# Patient Record
Sex: Female | Born: 1965
Health system: Southern US, Community
[De-identification: ages and names within clinical notes are randomized; demographics above are authoritative.]

## PROBLEM LIST (undated history)

## (undated) DIAGNOSIS — I10 Essential (primary) hypertension: Secondary | ICD-10-CM

## (undated) DIAGNOSIS — N200 Calculus of kidney: Secondary | ICD-10-CM

## (undated) DIAGNOSIS — G4730 Sleep apnea, unspecified: Secondary | ICD-10-CM

## (undated) DIAGNOSIS — H269 Unspecified cataract: Secondary | ICD-10-CM

## (undated) DIAGNOSIS — M5030 Other cervical disc degeneration, unspecified cervical region: Secondary | ICD-10-CM

## (undated) DIAGNOSIS — R011 Cardiac murmur, unspecified: Secondary | ICD-10-CM

## (undated) DIAGNOSIS — F32A Depression, unspecified: Secondary | ICD-10-CM

## (undated) DIAGNOSIS — M1990 Unspecified osteoarthritis, unspecified site: Secondary | ICD-10-CM

## (undated) DIAGNOSIS — F419 Anxiety disorder, unspecified: Secondary | ICD-10-CM

## (undated) DIAGNOSIS — M797 Fibromyalgia: Secondary | ICD-10-CM

## (undated) DIAGNOSIS — I639 Cerebral infarction, unspecified: Secondary | ICD-10-CM

## (undated) DIAGNOSIS — K219 Gastro-esophageal reflux disease without esophagitis: Secondary | ICD-10-CM

## (undated) DIAGNOSIS — J45909 Unspecified asthma, uncomplicated: Secondary | ICD-10-CM

## (undated) DIAGNOSIS — G43909 Migraine, unspecified, not intractable, without status migrainosus: Secondary | ICD-10-CM

## (undated) DIAGNOSIS — T7840XA Allergy, unspecified, initial encounter: Secondary | ICD-10-CM

## (undated) DIAGNOSIS — M5020 Other cervical disc displacement, unspecified cervical region: Secondary | ICD-10-CM

## (undated) DIAGNOSIS — F329 Major depressive disorder, single episode, unspecified: Secondary | ICD-10-CM

## (undated) HISTORY — DX: Gastro-esophageal reflux disease without esophagitis: K21.9

## (undated) HISTORY — PX: ABDOMINAL HYSTERECTOMY: SHX81

## (undated) HISTORY — DX: Anxiety disorder, unspecified: F41.9

## (undated) HISTORY — PX: KNEE SURGERY: SHX244

## (undated) HISTORY — DX: Other cervical disc displacement, unspecified cervical region: M50.20

## (undated) HISTORY — PX: CHOLECYSTECTOMY: SHX55

## (undated) HISTORY — PX: TOTAL KNEE ARTHROPLASTY: SHX125

## (undated) HISTORY — DX: Depression, unspecified: F32.A

## (undated) HISTORY — DX: Sleep apnea, unspecified: G47.30

## (undated) HISTORY — PX: NASAL SINUS SURGERY: SHX719

## (undated) HISTORY — DX: Cardiac murmur, unspecified: R01.1

## (undated) HISTORY — DX: Unspecified asthma, uncomplicated: J45.909

## (undated) HISTORY — DX: Unspecified osteoarthritis, unspecified site: M19.90

## (undated) HISTORY — PX: LAPAROSCOPIC GASTRIC BANDING: SHX1100

## (undated) HISTORY — DX: Other cervical disc degeneration, unspecified cervical region: M50.30

## (undated) HISTORY — DX: Unspecified cataract: H26.9

## (undated) HISTORY — DX: Allergy, unspecified, initial encounter: T78.40XA

---
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
## 2012-01-01 DIAGNOSIS — I639 Cerebral infarction, unspecified: Secondary | ICD-10-CM

## 2012-01-01 HISTORY — DX: Cerebral infarction, unspecified: I63.9

## 2013-12-21 ENCOUNTER — Encounter (HOSPITAL_BASED_OUTPATIENT_CLINIC_OR_DEPARTMENT_OTHER): Payer: Self-pay | Admitting: Emergency Medicine

## 2013-12-21 ENCOUNTER — Emergency Department (HOSPITAL_BASED_OUTPATIENT_CLINIC_OR_DEPARTMENT_OTHER): Payer: 59

## 2013-12-21 ENCOUNTER — Emergency Department (HOSPITAL_BASED_OUTPATIENT_CLINIC_OR_DEPARTMENT_OTHER)
Admission: EM | Admit: 2013-12-21 | Discharge: 2013-12-21 | Disposition: A | Discharge status: Home / Self Care | Payer: 59 | Attending: Emergency Medicine | Admitting: Emergency Medicine

## 2013-12-21 DIAGNOSIS — G43909 Migraine, unspecified, not intractable, without status migrainosus: Secondary | ICD-10-CM | POA: Insufficient documentation

## 2013-12-21 DIAGNOSIS — R61 Generalized hyperhidrosis: Secondary | ICD-10-CM | POA: Insufficient documentation

## 2013-12-21 DIAGNOSIS — Z791 Long term (current) use of non-steroidal anti-inflammatories (NSAID): Secondary | ICD-10-CM | POA: Insufficient documentation

## 2013-12-21 DIAGNOSIS — Z79899 Other long term (current) drug therapy: Secondary | ICD-10-CM | POA: Insufficient documentation

## 2013-12-21 DIAGNOSIS — M545 Low back pain, unspecified: Secondary | ICD-10-CM | POA: Insufficient documentation

## 2013-12-21 DIAGNOSIS — Z8673 Personal history of transient ischemic attack (TIA), and cerebral infarction without residual deficits: Secondary | ICD-10-CM | POA: Insufficient documentation

## 2013-12-21 DIAGNOSIS — Z7982 Long term (current) use of aspirin: Secondary | ICD-10-CM | POA: Insufficient documentation

## 2013-12-21 DIAGNOSIS — IMO0001 Reserved for inherently not codable concepts without codable children: Secondary | ICD-10-CM | POA: Insufficient documentation

## 2013-12-21 DIAGNOSIS — R197 Diarrhea, unspecified: Secondary | ICD-10-CM | POA: Insufficient documentation

## 2013-12-21 DIAGNOSIS — N390 Urinary tract infection, site not specified: Secondary | ICD-10-CM | POA: Insufficient documentation

## 2013-12-21 DIAGNOSIS — J069 Acute upper respiratory infection, unspecified: Secondary | ICD-10-CM | POA: Insufficient documentation

## 2013-12-21 HISTORY — DX: Migraine, unspecified, not intractable, without status migrainosus: G43.909

## 2013-12-21 HISTORY — DX: Fibromyalgia: M79.7

## 2013-12-21 HISTORY — DX: Cerebral infarction, unspecified: I63.9

## 2013-12-21 LAB — URINE MICROSCOPIC-ADD ON

## 2013-12-21 LAB — URINALYSIS, ROUTINE W REFLEX MICROSCOPIC
Bilirubin Urine: NEGATIVE
Ketones, ur: NEGATIVE mg/dL
Nitrite: NEGATIVE
Urobilinogen, UA: 0.2 mg/dL (ref 0.0–1.0)
pH: 6 (ref 5.0–8.0)

## 2013-12-21 MED ORDER — HYDROCODONE-ACETAMINOPHEN 5-325 MG PO TABS
2.0000 | ORAL_TABLET | Freq: Once | ORAL | Status: AC
  Administered 2013-12-21: 2 via ORAL
  Filled 2013-12-21: qty 2

## 2013-12-21 MED ORDER — CEPHALEXIN 500 MG PO CAPS: 500.0000 mg | ORAL_CAPSULE | Freq: Four times a day (QID) | ORAL | Status: DC

## 2013-12-21 MED ORDER — HYDROCODONE-ACETAMINOPHEN 5-325 MG PO TABS: 1.0000 | ORAL_TABLET | ORAL | Status: DC | PRN

## 2013-12-21 MED ORDER — FLUCONAZOLE 150 MG PO TABS: 150.0000 mg | ORAL_TABLET | Freq: Once | ORAL | Status: AC | PRN

## 2013-12-21 NOTE — ED Notes (Signed)
Pt to room 10 in w/c, reports cough with clear sputum, nasal congestion with clear drainage.

## 2013-12-21 NOTE — ED Provider Notes (Signed)
CSN: [id number]     Arrival date & time 12/21/13  1150 History   First MD Initiated Contact with Patient 12/21/13 1201     Chief Complaint  Patient presents with  . Cough  . Nasal Congestion   (Consider location/radiation/quality/duration/timing/severity/associated sxs/prior Treatment) Patient is a 48 y.o. female presenting with URI.  URI Presenting symptoms: congestion, cough and rhinorrhea   Presenting symptoms: no fever   Cough:    Cough characteristics:  Productive   Sputum characteristics: Thick, clear.   Severity:  Moderate   Onset quality:  Gradual   Duration:  3 days   Timing:  Constant   Progression:  Worsening   Chronicity:  New Exacerbated by: Coughing. Ineffective treatments: Ibuprofen, NyQuil. Associated symptoms: headaches and myalgias   Associated symptoms comment:  Low back pain Risk factors: sick contacts (daughter had strep throat last week)     Past Medical History  Diagnosis Date  . Fibromyalgia   . Migraines   . CVA (cerebral vascular accident)    Past Surgical History  Procedure Laterality Date  . Total knee arthroplasty    . Abdominal hysterectomy    . Cholecystectomy     History reviewed. No pertinent family history. History  Substance Use Topics  . Smoking status: Passive Smoke Exposure - Never Smoker  . Smokeless tobacco: Not on file  . Alcohol Use: Not on file   OB History   Grav Para Term Preterm Abortions TAB SAB Ect Mult Living                 Review of Systems  Constitutional: Positive for chills and diaphoresis. Negative for fever.  HENT: Positive for congestion and rhinorrhea.   Respiratory: Positive for cough. Negative for shortness of breath.   Cardiovascular: Negative for chest pain.  Gastrointestinal: Positive for diarrhea. Negative for nausea, vomiting and abdominal pain.  Musculoskeletal: Positive for myalgias.  Neurological: Positive for headaches.  All other systems reviewed and are negative.    Allergies    Review of patient's allergies indicates no known allergies.  Home Medications   Current Outpatient Rx  Name  Route  Sig  Dispense  Refill  . aspirin 81 MG tablet   Oral   Take 81 mg by mouth daily.         . meloxicam (MOBIC) 15 MG tablet   Oral   Take 15 mg by mouth daily.         Marland Kitchen rOPINIRole (REQUIP) 0.5 MG tablet   Oral   Take 0.5 mg by mouth 3 (three) times daily.         Marland Kitchen trimethobenzamide (TIGAN) 300 MG capsule   Oral   Take 300 mg by mouth every morning.         . venlafaxine XR (EFFEXOR-XR) 150 MG 24 hr capsule   Oral   Take 150 mg by mouth daily with breakfast.          BP 115/66  Pulse 81  Temp(Src) 98.6 F (37 C) (Oral)  Resp 18  SpO2 98% Physical Exam  Nursing note and vitals reviewed. Constitutional: She is oriented to person, place, and time. She appears well-developed and well-nourished. No distress.  HENT:  Head: Normocephalic and atraumatic.  Mouth/Throat: Oropharynx is clear and moist.  Eyes: Conjunctivae are normal. Pupils are equal, round, and reactive to light. No scleral icterus.  Neck: Neck supple.  Cardiovascular: Normal rate, regular rhythm, normal heart sounds and intact distal pulses.   No murmur heard.  Pulmonary/Chest: Effort normal and breath sounds normal. No stridor. No respiratory distress. She has no rales.  Abdominal: Soft. Bowel sounds are normal. She exhibits no distension. There is no tenderness.  Musculoskeletal: Normal range of motion.       Back:  Neurological: She is alert and oriented to person, place, and time. She has normal strength. Gait normal.  Reflex Scores:      Patellar reflexes are 2+ on the right side and 2+ on the left side. Normal bilateral lower extremity strength  Skin: Skin is warm and dry. No rash noted.  Psychiatric: She has a normal mood and affect. Her behavior is normal.    ED Course  Procedures (including critical care time) Labs Review Labs Reviewed  URINALYSIS, ROUTINE W REFLEX  MICROSCOPIC - Abnormal; Notable for the following:    Hgb urine dipstick MODERATE (*)    Leukocytes, UA TRACE (*)    All other components within normal limits  URINE MICROSCOPIC-ADD ON - Abnormal; Notable for the following:    Bacteria, UA MANY (*)    Casts HYALINE CASTS (*)    All other components within normal limits  URINE CULTURE   Imaging Review No results found.  EKG Interpretation   None       MDM   1. URI, acute   2. UTI (urinary tract infection)    48 year old female with chills, sweats, cough, back pain. Her daughter had strep throat last week. She also has some congestion and sore throat, however, she declines stress testing or treatment. She has had some loss of urine, which she thinks may be related to her cough. She has frequent urinary tract infections. Her urinalysis appears consistent with urinary tract infection. I think that her back pain is secondary to urinary tract infection plus or minus musculoskeletal pain from strain from coughing. Spinal cord impingement was considered due to her pain and loss of urine, but feel UTI and back strain is much better explanation for her symptoms. Do not think the MR imaging is indicated at this time.  Her chest x-ray is clear.  Will provide antibiotics for UTI.  Return precautions given.      Candyce Churn, MD 12/21/13 1351

## 2013-12-22 LAB — URINE CULTURE: Colony Count: 50000

## 2015-04-13 DIAGNOSIS — F418 Other specified anxiety disorders: Secondary | ICD-10-CM | POA: Insufficient documentation

## 2015-05-20 DIAGNOSIS — K9589 Other complications of other bariatric procedure: Secondary | ICD-10-CM | POA: Insufficient documentation

## 2015-05-20 DIAGNOSIS — G4733 Obstructive sleep apnea (adult) (pediatric): Secondary | ICD-10-CM | POA: Insufficient documentation

## 2016-02-03 DIAGNOSIS — I10 Essential (primary) hypertension: Secondary | ICD-10-CM | POA: Insufficient documentation

## 2016-03-13 DIAGNOSIS — M545 Low back pain, unspecified: Secondary | ICD-10-CM | POA: Insufficient documentation

## 2016-03-13 DIAGNOSIS — G43009 Migraine without aura, not intractable, without status migrainosus: Secondary | ICD-10-CM | POA: Insufficient documentation

## 2016-03-13 DIAGNOSIS — M5020 Other cervical disc displacement, unspecified cervical region: Secondary | ICD-10-CM | POA: Insufficient documentation

## 2016-03-13 DIAGNOSIS — M546 Pain in thoracic spine: Secondary | ICD-10-CM | POA: Insufficient documentation

## 2016-03-13 DIAGNOSIS — M542 Cervicalgia: Secondary | ICD-10-CM | POA: Insufficient documentation

## 2016-03-13 DIAGNOSIS — G4700 Insomnia, unspecified: Secondary | ICD-10-CM | POA: Insufficient documentation

## 2016-09-21 DIAGNOSIS — G2581 Restless legs syndrome: Secondary | ICD-10-CM | POA: Insufficient documentation

## 2017-03-21 DIAGNOSIS — J4530 Mild persistent asthma, uncomplicated: Secondary | ICD-10-CM | POA: Insufficient documentation

## 2017-05-30 ENCOUNTER — Encounter (HOSPITAL_BASED_OUTPATIENT_CLINIC_OR_DEPARTMENT_OTHER): Payer: Self-pay | Admitting: *Deleted

## 2017-05-30 ENCOUNTER — Emergency Department (HOSPITAL_BASED_OUTPATIENT_CLINIC_OR_DEPARTMENT_OTHER): Payer: 59

## 2017-05-30 ENCOUNTER — Emergency Department (HOSPITAL_BASED_OUTPATIENT_CLINIC_OR_DEPARTMENT_OTHER)
Admission: EM | Admit: 2017-05-30 | Discharge: 2017-05-30 | Disposition: A | Discharge status: Home / Self Care | Payer: 59 | Attending: Emergency Medicine | Admitting: Emergency Medicine

## 2017-05-30 DIAGNOSIS — Z7722 Contact with and (suspected) exposure to environmental tobacco smoke (acute) (chronic): Secondary | ICD-10-CM | POA: Insufficient documentation

## 2017-05-30 DIAGNOSIS — Z7982 Long term (current) use of aspirin: Secondary | ICD-10-CM | POA: Diagnosis not present

## 2017-05-30 DIAGNOSIS — M79605 Pain in left leg: Secondary | ICD-10-CM

## 2017-05-30 DIAGNOSIS — Z79899 Other long term (current) drug therapy: Secondary | ICD-10-CM | POA: Insufficient documentation

## 2017-05-30 DIAGNOSIS — I10 Essential (primary) hypertension: Secondary | ICD-10-CM | POA: Diagnosis not present

## 2017-05-30 DIAGNOSIS — M79662 Pain in left lower leg: Secondary | ICD-10-CM | POA: Diagnosis present

## 2017-05-30 DIAGNOSIS — R0602 Shortness of breath: Secondary | ICD-10-CM | POA: Diagnosis not present

## 2017-05-30 HISTORY — DX: Essential (primary) hypertension: I10

## 2017-05-30 LAB — CBC WITH DIFFERENTIAL/PLATELET
Basophils Absolute: 0 10*3/uL (ref 0.0–0.1)
Basophils Relative: 0 %
Eosinophils Absolute: 0.2 10*3/uL (ref 0.0–0.7)
Eosinophils Relative: 3 %
HEMATOCRIT: 37.5 % (ref 36.0–46.0)
HEMOGLOBIN: 12.3 g/dL (ref 12.0–15.0)
LYMPHS ABS: 1.4 10*3/uL (ref 0.7–4.0)
Lymphocytes Relative: 20 %
MCH: 27.9 pg (ref 26.0–34.0)
MCHC: 32.8 g/dL (ref 30.0–36.0)
MCV: 85 fL (ref 78.0–100.0)
MONOS PCT: 8 %
Monocytes Absolute: 0.6 10*3/uL (ref 0.1–1.0)
NEUTROS ABS: 4.8 10*3/uL (ref 1.7–7.7)
Neutrophils Relative %: 69 %
Platelets: 180 10*3/uL (ref 150–400)
RBC: 4.41 MIL/uL (ref 3.87–5.11)
RDW: 14.1 % (ref 11.5–15.5)
WBC: 7 10*3/uL (ref 4.0–10.5)

## 2017-05-30 LAB — COMPREHENSIVE METABOLIC PANEL
ALK PHOS: 68 U/L (ref 38–126)
ALT: 24 U/L (ref 14–54)
ANION GAP: 11 (ref 5–15)
AST: 22 U/L (ref 15–41)
Albumin: 3.9 g/dL (ref 3.5–5.0)
BUN: 14 mg/dL (ref 6–20)
CALCIUM: 9.1 mg/dL (ref 8.9–10.3)
CO2: 27 mmol/L (ref 22–32)
Chloride: 101 mmol/L (ref 101–111)
Creatinine, Ser: 0.66 mg/dL (ref 0.44–1.00)
GFR calc Af Amer: >60 mL/min (ref >60)
GFR calc non Af Amer: >60 mL/min (ref >60)
GLUCOSE: 91 mg/dL (ref 65–99)
Potassium: 4.1 mmol/L (ref 3.5–5.1)
Sodium: 139 mmol/L (ref 135–145)
Total Bilirubin: 0.5 mg/dL (ref 0.3–1.2)
Total Protein: 6.8 g/dL (ref 6.5–8.1)

## 2017-05-30 LAB — D-DIMER, QUANTITATIVE: D-Dimer, Quant: <0.27 ug/mL-FEU (ref 0.00–0.50)

## 2017-05-30 LAB — TROPONIN I

## 2017-05-30 MED ORDER — IOPAMIDOL (ISOVUE-370) INJECTION 76%
100.0000 mL | Freq: Once | INTRAVENOUS | Status: AC | PRN
  Administered 2017-05-30: 100 mL via INTRAVENOUS

## 2017-05-30 MED ORDER — MORPHINE SULFATE (PF) 4 MG/ML IV SOLN
4.0000 mg | Freq: Once | INTRAVENOUS | Status: AC
  Administered 2017-05-30: 4 mg via INTRAVENOUS

## 2017-05-30 MED ORDER — MORPHINE SULFATE (PF) 4 MG/ML IV SOLN
INTRAVENOUS | Status: AC
  Filled 2017-05-30: qty 1

## 2017-05-30 MED ORDER — HYDROCODONE-ACETAMINOPHEN 5-325 MG PO TABS: 1.0000 | ORAL_TABLET | Freq: Four times a day (QID) | ORAL | 0 refills | Status: DC | PRN

## 2017-05-30 MED FILL — HYDROCODON-APAP 5-325: 5-325 | 2 days supply | Qty: 15 | Fill #0

## 2017-05-30 NOTE — ED Triage Notes (Signed)
Pain in her left leg since last night. Swelling in her foot. She took Ibuprofen and elevated her leg. Woke this am with pain in her calf into her groin and the left side of her neck.

## 2017-05-30 NOTE — Discharge Instructions (Signed)
Hydrocodone as prescribed as needed for pain.  Follow-up with your primary Dr. if you're not improving in the next week, and return to the ER if symptoms significantly worsen or change.

## 2017-05-30 NOTE — ED Provider Notes (Signed)
[facility] Provider Note   CSN: [id number] Arrival date & time: 05/30/17  1222     History   Chief Complaint Chief Complaint  Patient presents with  . Leg Pain    HPI Nicole Russell is a 52 y.o. female.  Patient is a 52 year old female with past medical history of morbid obesity, hypertension, fibromyalgia, and prior CVA/TIA. She presents today for evaluation of left leg pain and swelling that started yesterday. This began in the absence of any injury or trauma. She states that she woke up with swelling in her ankle and foot, then as the day went on developed cramping in her left calf. She now reports pain in the left side of her neck and feeling short of breath. She denies any fevers or chills. She denies any nausea or diaphoresis.   The history is provided by the patient.  Leg Pain   This is a new problem. The current episode started yesterday. The problem occurs constantly. The problem has been gradually worsening. Pain location: Left lower leg. The quality of the pain is described as constant. The pain is moderate. Pertinent negatives include no numbness and full range of motion. The symptoms are aggravated by activity and standing.    Past Medical History:  Diagnosis Date  . CVA (cerebral vascular accident) ([facility])   . Fibromyalgia   . Hypertension   . Migraines     There are no active problems to display for this patient.   Past Surgical History:  Procedure Laterality Date  . ABDOMINAL HYSTERECTOMY    . CHOLECYSTECTOMY    . KNEE SURGERY    . LAPAROSCOPIC GASTRIC BANDING    . NASAL SINUS SURGERY    . TOTAL KNEE ARTHROPLASTY      OB History    No data available       Home Medications    Prior to Admission medications   Medication Sig Start Date End Date Taking? Authorizing Provider  aspirin 81 MG tablet Take 81 mg by mouth daily.   Yes [provider]  BuPROPion HCl (WELLBUTRIN PO) Take by mouth.   Yes [provider]    LISINOPRIL PO Take by mouth.   Yes [provider]  meloxicam (MOBIC) 15 MG tablet Take 15 mg by mouth daily.   Yes [provider]  Montelukast Sodium (SINGULAIR PO) Take by mouth.   Yes [provider]  rOPINIRole (REQUIP) 0.5 MG tablet Take 0.5 mg by mouth 3 (three) times daily.   Yes [provider]  Sertraline HCl (ZOLOFT PO) Take by mouth.   Yes [provider]  trimethobenzamide (TIGAN) 300 MG capsule Take 300 mg by mouth every morning.   Yes [provider]  cephALEXin (KEFLEX) 500 MG capsule Take 1 capsule (500 mg total) by mouth 4 (four) times daily. 12/21/13   Blake DivineWofford, John, MD  HYDROcodone-acetaminophen (NORCO/VICODIN) 5-325 MG per tablet Take 1 tablet by mouth every 4 (four) hours as needed. 12/21/13   Blake DivineWofford, John, MD  venlafaxine XR (EFFEXOR-XR) 150 MG 24 hr capsule Take 150 mg by mouth daily with breakfast.    [provider]    Family History No family history on file.  Social History Social History  Substance Use Topics  . Smoking status: Passive Smoke Exposure - Never Smoker  . Smokeless tobacco: Never Used  . Alcohol use Yes     Allergies   Patient has no known allergies.   Review of Systems Review of Systems  Neurological: Negative for numbness.  All other systems reviewed and are negative.    Physical Exam Updated Vital Signs BP (!) 167/95   Pulse 78   Temp 98.6 F (37 C) (Oral)   Resp 13   Ht 5\' 2"  (1.575 m)   Wt (!) 146.5 kg (323 lb)   SpO2 100%   BMI 59.08 kg/m   Physical Exam  Constitutional: She is oriented to person, place, and time. She appears well-developed and well-nourished. No distress.  HENT:  Head: Normocephalic and atraumatic.  Neck: Normal range of motion. Neck supple.  Cardiovascular: Normal rate and regular rhythm.  Exam reveals no gallop and no friction rub.   No murmur heard. Pulmonary/Chest: Effort normal and breath sounds normal. No respiratory distress.  She has no wheezes.  Abdominal: Soft. Bowel sounds are normal. She exhibits no distension. There is no tenderness.  Musculoskeletal: Normal range of motion. She exhibits no edema.   There is no obvious swelling of the left leg.  There is some tenderness to the ankle and calf. Homan sign is absent bilaterally.  Neurological: She is alert and oriented to person, place, and time.  Skin: Skin is warm and dry. She is not diaphoretic.  Nursing note and vitals reviewed.    ED Treatments / Results  Labs (all labs ordered are listed, but only abnormal results are displayed) Labs Reviewed  CBC WITH DIFFERENTIAL/PLATELET  COMPREHENSIVE METABOLIC PANEL  TROPONIN I  D-DIMER, QUANTITATIVE (NOT AT [facility])    EKG ED ECG REPORT   Date: 05/30/2017  Rate: 77  Rhythm: normal sinus rhythm  QRS Axis: normal  Intervals: normal  ST/T Wave abnormalities: normal  Conduction Disutrbances:none  Narrative Interpretation:   Old EKG Reviewed: none available  I have personally reviewed the EKG tracing and agree with the computerized printout as noted.   Radiology No results found.  Procedures Procedures (including critical care time)  Medications Ordered in ED Medications - No data to display   Initial Impression / Assessment and Plan / ED Course  I have reviewed the triage vital signs and the nursing notes.  Pertinent labs & imaging results that were available during my care of the patient were reviewed by me and considered in my medical decision making (see chart for details).  Patient presents here with complaints of left leg and foot pain and swelling since yesterday, then developed neck pain and shortness of breath this morning. Her workup reveals no obvious findings on physical examination, negative d-dimer, negative DVT study, and negative PE study. Her EKG and troponin are also not suggestive of a cardiac etiology. I am uncertain as to what is causing her leg pain, however I suspect a  musculoskeletal etiology. She will be treated with pain medicine, rest, and as needed follow-up with her primary doctor.  Final Clinical Impressions(s) / ED Diagnoses   Final diagnoses:  Shortness of breath    New Prescriptions New Prescriptions   No medications on file     Geoffery Lyons, MD 05/30/17 1434

## 2017-05-30 NOTE — ED Notes (Signed)
Discussed with patient that there not any current orders for medications. Patient agreed to go to scan while waiting for medication orders

## 2017-05-30 NOTE — ED Notes (Signed)
Patient transported to CT 

## 2017-05-30 NOTE — ED Notes (Signed)
Went in to talk to the patient and she reports that the pain in her leg is worse and she is now very anxious.

## 2017-10-07 DIAGNOSIS — K219 Gastro-esophageal reflux disease without esophagitis: Secondary | ICD-10-CM | POA: Insufficient documentation

## 2018-01-07 DIAGNOSIS — Z8673 Personal history of transient ischemic attack (TIA), and cerebral infarction without residual deficits: Secondary | ICD-10-CM | POA: Insufficient documentation

## 2018-01-07 DIAGNOSIS — E7800 Pure hypercholesterolemia, unspecified: Secondary | ICD-10-CM | POA: Insufficient documentation

## 2018-01-07 DIAGNOSIS — Z8659 Personal history of other mental and behavioral disorders: Secondary | ICD-10-CM | POA: Insufficient documentation

## 2018-01-07 DIAGNOSIS — I639 Cerebral infarction, unspecified: Secondary | ICD-10-CM | POA: Insufficient documentation

## 2018-01-07 DIAGNOSIS — F419 Anxiety disorder, unspecified: Secondary | ICD-10-CM | POA: Insufficient documentation

## 2018-01-07 DIAGNOSIS — Z9109 Other allergy status, other than to drugs and biological substances: Secondary | ICD-10-CM | POA: Insufficient documentation

## 2018-01-07 DIAGNOSIS — D649 Anemia, unspecified: Secondary | ICD-10-CM | POA: Insufficient documentation

## 2018-01-07 DIAGNOSIS — Z8679 Personal history of other diseases of the circulatory system: Secondary | ICD-10-CM | POA: Insufficient documentation

## 2018-01-07 DIAGNOSIS — M1990 Unspecified osteoarthritis, unspecified site: Secondary | ICD-10-CM | POA: Insufficient documentation

## 2018-01-07 DIAGNOSIS — N200 Calculus of kidney: Secondary | ICD-10-CM | POA: Insufficient documentation

## 2018-01-07 DIAGNOSIS — L309 Dermatitis, unspecified: Secondary | ICD-10-CM | POA: Insufficient documentation

## 2018-01-09 DIAGNOSIS — Z8639 Personal history of other endocrine, nutritional and metabolic disease: Secondary | ICD-10-CM | POA: Insufficient documentation

## 2018-02-03 ENCOUNTER — Emergency Department (HOSPITAL_COMMUNITY)
Admission: EM | Admit: 2018-02-03 | Discharge: 2018-02-03 | Disposition: A | Discharge status: Home / Self Care | Payer: 59 | Attending: Emergency Medicine | Admitting: Emergency Medicine

## 2018-02-03 ENCOUNTER — Emergency Department (HOSPITAL_COMMUNITY): Payer: 59

## 2018-02-03 DIAGNOSIS — Z79899 Other long term (current) drug therapy: Secondary | ICD-10-CM | POA: Insufficient documentation

## 2018-02-03 DIAGNOSIS — I10 Essential (primary) hypertension: Secondary | ICD-10-CM | POA: Diagnosis not present

## 2018-02-03 DIAGNOSIS — Z7722 Contact with and (suspected) exposure to environmental tobacco smoke (acute) (chronic): Secondary | ICD-10-CM | POA: Diagnosis not present

## 2018-02-03 DIAGNOSIS — Z7982 Long term (current) use of aspirin: Secondary | ICD-10-CM | POA: Insufficient documentation

## 2018-02-03 DIAGNOSIS — R1084 Generalized abdominal pain: Secondary | ICD-10-CM | POA: Insufficient documentation

## 2018-02-03 DIAGNOSIS — M79602 Pain in left arm: Secondary | ICD-10-CM | POA: Diagnosis not present

## 2018-02-03 DIAGNOSIS — M542 Cervicalgia: Secondary | ICD-10-CM

## 2018-02-03 DIAGNOSIS — M792 Neuralgia and neuritis, unspecified: Secondary | ICD-10-CM

## 2018-02-03 DIAGNOSIS — M25512 Pain in left shoulder: Secondary | ICD-10-CM | POA: Diagnosis not present

## 2018-02-03 LAB — CBC WITH DIFFERENTIAL/PLATELET
BASOS PCT: 0 %
Basophils Absolute: 0 10*3/uL (ref 0.0–0.1)
EOS ABS: 0.1 10*3/uL (ref 0.0–0.7)
Eosinophils Relative: 1 %
HCT: 37.6 % (ref 36.0–46.0)
HEMOGLOBIN: 12.2 g/dL (ref 12.0–15.0)
Lymphocytes Relative: 24 %
Lymphs Abs: 1.8 10*3/uL (ref 0.7–4.0)
MCH: 28.4 pg (ref 26.0–34.0)
MCHC: 32.4 g/dL (ref 30.0–36.0)
MCV: 87.4 fL (ref 78.0–100.0)
Monocytes Absolute: 0.4 10*3/uL (ref 0.1–1.0)
Monocytes Relative: 5 %
NEUTROS ABS: 5.1 10*3/uL (ref 1.7–7.7)
NEUTROS PCT: 70 %
Platelets: 168 10*3/uL (ref 150–400)
RBC: 4.3 MIL/uL (ref 3.87–5.11)
RDW: 14.3 % (ref 11.5–15.5)
WBC: 7.4 10*3/uL (ref 4.0–10.5)

## 2018-02-03 LAB — I-STAT TROPONIN, ED
TROPONIN I, POC: 0 ng/mL (ref 0.00–0.08)
Troponin i, poc: 0 ng/mL (ref 0.00–0.08)

## 2018-02-03 LAB — URINALYSIS, ROUTINE W REFLEX MICROSCOPIC
Bilirubin Urine: NEGATIVE
Glucose, UA: NEGATIVE mg/dL
Ketones, ur: NEGATIVE mg/dL
Leukocytes, UA: NEGATIVE
Nitrite: NEGATIVE
PROTEIN: NEGATIVE mg/dL
Specific Gravity, Urine: 1.01 (ref 1.005–1.030)
pH: 6 (ref 5.0–8.0)

## 2018-02-03 LAB — I-STAT CG4 LACTIC ACID, ED
Lactic Acid, Venous: 1.07 mmol/L (ref 0.5–1.9)
Lactic Acid, Venous: 0.56 mmol/L (ref 0.5–1.9)

## 2018-02-03 LAB — COMPREHENSIVE METABOLIC PANEL
ALBUMIN: 3.8 g/dL (ref 3.5–5.0)
ALK PHOS: 60 U/L (ref 38–126)
ALT: 25 U/L (ref 14–54)
AST: 20 U/L (ref 15–41)
Anion gap: 12 (ref 5–15)
BUN: 20 mg/dL (ref 6–20)
CALCIUM: 9 mg/dL (ref 8.9–10.3)
CO2: 24 mmol/L (ref 22–32)
CREATININE: 0.64 mg/dL (ref 0.44–1.00)
Chloride: 100 mmol/L — ABNORMAL LOW (ref 101–111)
GFR calc Af Amer: >60 mL/min (ref >60)
GFR calc non Af Amer: >60 mL/min (ref >60)
GLUCOSE: 93 mg/dL (ref 65–99)
Potassium: 3.7 mmol/L (ref 3.5–5.1)
SODIUM: 136 mmol/L (ref 135–145)
Total Bilirubin: 0.5 mg/dL (ref 0.3–1.2)
Total Protein: 6.3 g/dL — ABNORMAL LOW (ref 6.5–8.1)

## 2018-02-03 LAB — D-DIMER, QUANTITATIVE: D-Dimer, Quant: 0.39 ug/mL-FEU (ref 0.00–0.50)

## 2018-02-03 LAB — PROTIME-INR
INR: 1.13
Prothrombin Time: 14.4 seconds (ref 11.4–15.2)

## 2018-02-03 LAB — LIPASE, BLOOD: Lipase: 27 U/L (ref 11–51)

## 2018-02-03 MED ORDER — MORPHINE SULFATE (PF) 4 MG/ML IV SOLN
4.0000 mg | Freq: Once | INTRAVENOUS | Status: AC
  Administered 2018-02-03: 4 mg via INTRAVENOUS
  Filled 2018-02-03: qty 1

## 2018-02-03 MED ORDER — IOPAMIDOL (ISOVUE-300) INJECTION 61%
INTRAVENOUS | Status: AC
  Administered 2018-02-03: 100 mL
  Filled 2018-02-03: qty 100

## 2018-02-03 MED ORDER — SODIUM CHLORIDE 0.9 % IV BOLUS (SEPSIS)
500.0000 mL | Freq: Once | INTRAVENOUS | Status: AC
  Administered 2018-02-03: 500 mL via INTRAVENOUS

## 2018-02-03 MED ORDER — ONDANSETRON HCL 4 MG/2ML IJ SOLN
4.0000 mg | Freq: Once | INTRAMUSCULAR | Status: AC
  Administered 2018-02-03: 4 mg via INTRAVENOUS
  Filled 2018-02-03: qty 2

## 2018-02-03 MED ORDER — OXYCODONE-ACETAMINOPHEN 5-325 MG PO TABS: 1.0000 | ORAL_TABLET | ORAL | 0 refills | Status: DC | PRN

## 2018-02-03 NOTE — ED Triage Notes (Signed)
Pt here with c/o left side neck and shoulder pain , pt has hx of  disk problems in her neck , pt received zofran  For some dizziness

## 2018-02-03 NOTE — Discharge Instructions (Signed)
Your workup today showed evidence of spinal stenosis and given your distribution of pain we suspect it is a radicular pain.  Your other workup was reassuring in regards to the chest and abdominal pain.  Please stay hydrated and use the pain medicine to help with your symptoms.  Please follow-up with a primary care physician and a spine doctor for further management.  If any symptoms change or worsen, please return to the nearest emergency department.

## 2018-02-03 NOTE — ED Notes (Signed)
ED Provider at bedside. 

## 2018-02-03 NOTE — ED Notes (Signed)
Patient transported to X-ray 

## 2018-02-03 NOTE — ED Provider Notes (Signed)
[facility] EMERGENCY DEPARTMENT Provider Note   CSN: [id number] Arrival date & time: 02/03/18  1146     History   Chief Complaint Chief Complaint  Patient presents with  . Shoulder Pain  . Neck Injury    HPI Nicole Russell is a 53 y.o. female.  The history is provided by the patient and medical records. No language interpreter was used.  Abdominal Pain   This is a new problem. The current episode started 6 to 12 hours ago. The problem occurs constantly. The problem has not changed since onset.The pain is associated with an unknown factor. The pain is located in the generalized abdominal region. The pain is moderate. Associated symptoms include nausea, vomiting and constipation. Pertinent negatives include diarrhea, dysuria and hematuria. Nothing relieves the symptoms.  Shoulder Pain   This is a new problem. The current episode started 6 to 12 hours ago. The problem occurs constantly. The problem has not changed since onset.The pain is present in the neck, left shoulder, left arm and left elbow. The quality of the pain is described as sharp. The pain is moderate. Pertinent negatives include no numbness, full range of motion, no stiffness and no tingling. She has tried nothing for the symptoms. The treatment provided no relief. There has been no history of extremity trauma.    Past Medical History:  Diagnosis Date  . CVA (cerebral vascular accident) (HCC)   . Fibromyalgia   . Hypertension   . Migraines     There are no active problems to display for this patient.   Past Surgical History:  Procedure Laterality Date  . ABDOMINAL HYSTERECTOMY    . CHOLECYSTECTOMY    . KNEE SURGERY    . LAPAROSCOPIC GASTRIC BANDING    . NASAL SINUS SURGERY    . TOTAL KNEE ARTHROPLASTY      OB History    No data available       Home Medications    Prior to Admission medications   Medication Sig Start Date End Date Taking? Authorizing Provider  aspirin 81 MG tablet  Take 81 mg by mouth daily.    [provider]  BuPROPion HCl (WELLBUTRIN PO) Take by mouth.    [provider]  cephALEXin (KEFLEX) 500 MG capsule Take 1 capsule (500 mg total) by mouth 4 (four) times daily. 12/21/13   Blake Divine, MD  HYDROcodone-acetaminophen (NORCO) 5-325 MG tablet Take 1-2 tablets by mouth every 6 (six) hours as needed. 05/30/17   Geoffery Lyons, MD  LISINOPRIL PO Take by mouth.    [provider]  meloxicam (MOBIC) 15 MG tablet Take 15 mg by mouth daily.    [provider]  Montelukast Sodium (SINGULAIR PO) Take by mouth.    [provider]  rOPINIRole (REQUIP) 0.5 MG tablet Take 0.5 mg by mouth 3 (three) times daily.    [provider]  Sertraline HCl (ZOLOFT PO) Take by mouth.    [provider]  trimethobenzamide (TIGAN) 300 MG capsule Take 300 mg by mouth every morning.    [provider]  venlafaxine XR (EFFEXOR-XR) 150 MG 24 hr capsule Take 150 mg by mouth daily with breakfast.    [provider]    Family History No family history on file.  Social History Social History   Tobacco Use  . Smoking status: Passive Smoke Exposure - Never Smoker  . Smokeless tobacco: Never Used  Substance Use Topics  . Alcohol use: Yes  .  Drug use: No     Allergies   Patient has no known allergies.   Review of Systems Review of Systems  Constitutional: Negative for chills, diaphoresis and fatigue.  HENT: Negative for congestion.   Eyes: Negative for visual disturbance.  Respiratory: Positive for chest tightness. Negative for cough, shortness of breath, wheezing and stridor.   Cardiovascular: Positive for chest pain. Negative for palpitations.  Gastrointestinal: Positive for abdominal pain, constipation, nausea and vomiting. Negative for diarrhea.  Genitourinary: Positive for flank pain. Negative for dysuria and hematuria.  Musculoskeletal: Positive for neck pain. Negative for back pain,  neck stiffness and stiffness.  Skin: Negative for rash and wound.  Neurological: Negative for dizziness, tingling, weakness and numbness.  Psychiatric/Behavioral: Negative for agitation and confusion.  All other systems reviewed and are negative.    Physical Exam Updated Vital Signs BP 126/70 (BP Location: Left Arm)   Pulse 72 Comment: Simultaneous filing. User may not have seen previous data.  Temp 98.6 F (37 C) (Oral)   Resp 15 Comment: Simultaneous filing. User may not have seen previous data.  SpO2 96% Comment: Simultaneous filing. User may not have seen previous data.  Physical Exam  Constitutional: She is oriented to person, place, and time. She appears well-developed and well-nourished. No distress.  HENT:  Head: Normocephalic and atraumatic.  Mouth/Throat: Oropharynx is clear and moist. No oropharyngeal exudate.  Eyes: Conjunctivae and EOM are normal. Pupils are equal, round, and reactive to light.  Neck: Normal range of motion. Neck supple. No spinous process tenderness present. Normal range of motion present.    Cardiovascular: Normal rate and intact distal pulses.  No murmur heard. Pulmonary/Chest: No stridor. No respiratory distress. She has no wheezes. She has no rales. She exhibits tenderness.    Abdominal: Soft. Bowel sounds are normal. She exhibits no distension. There is tenderness. There is no guarding.  Musculoskeletal: She exhibits tenderness.  Lymphadenopathy:    She has no cervical adenopathy.  Neurological: She is alert and oriented to person, place, and time. She is not disoriented. No cranial nerve deficit or sensory deficit. Coordination normal. GCS eye subscore is 4. GCS verbal subscore is 5. GCS motor subscore is 6.  No focal neuro deficits.   Skin: Capillary refill takes less than 2 seconds. No rash noted. She is not diaphoretic. No erythema.  Psychiatric: She has a normal mood and affect.  Nursing note and vitals reviewed.    ED Treatments /  Results  Labs (all labs ordered are listed, but only abnormal results are displayed) Labs Reviewed  COMPREHENSIVE METABOLIC PANEL - Abnormal; Notable for the following components:      Result Value   Chloride 100 (*)    Total Protein 6.3 (*)    All other components within normal limits  URINE CULTURE  CBC WITH DIFFERENTIAL/PLATELET  LIPASE, BLOOD  D-DIMER, QUANTITATIVE (NOT AT [facility])  PROTIME-INR  URINALYSIS, ROUTINE W REFLEX MICROSCOPIC  I-STAT CG4 LACTIC ACID, ED  I-STAT TROPONIN, ED  I-STAT CG4 LACTIC ACID, ED  I-STAT TROPONIN, ED    EKG  EKG Interpretation  Date/Time:  Monday February 03 2018 12:02:52 EST Ventricular Rate:  72 PR Interval:    QRS Duration: 100 QT Interval:  410 QTC Calculation: 449 R Axis:   4 Text Interpretation:  Sinus rhythm Low voltage, precordial leads When compared to prior, no significant changes seen,  No STEMI Confirmed by Theda Belfast ([id number]) on 02/03/2018 1:04:40 PM       Radiology Dg Chest 2  View  Result Date: 02/03/2018 CLINICAL DATA:  Midline left upper back pain.  Shortness of breath EXAM: CHEST  2 VIEW COMPARISON:  05/30/2017 FINDINGS: Heart and mediastinal contours are within normal limits. No focal opacities or effusions. No acute bony abnormality. IMPRESSION: No active cardiopulmonary disease. Electronically Signed   By: Charlett Nose M.D.   On: 02/03/2018 13:32   Ct Cervical Spine Wo Contrast  Result Date: 02/03/2018 CLINICAL DATA:  Left-sided neck pain radiating to the left shoulder. EXAM: CT CERVICAL SPINE WITHOUT CONTRAST TECHNIQUE: Multidetector CT imaging of the cervical spine was performed without intravenous contrast. Multiplanar CT image reconstructions were also generated. COMPARISON:  None. FINDINGS: Alignment: 3 mm anterolisthesis at C5-C6. Trace anterolisthesis at C4-C5. Skull base and vertebrae: No acute fracture. No primary bone lesion or focal pathologic process. Incomplete fusion of the posterior arch of C1. Soft tissues  and spinal canal: No prevertebral fluid or swelling. No visible canal hematoma. Disc levels: Moderate disc height loss at C6-C7 with moderate bilateral facet uncovertebral hypertrophy resulting in mild central spinal canal stenosis and moderate bilateral neuroforaminal stenosis. Scattered facet arthropathy throughout the remaining cervical spine, moderate on the right at C5-C6. Moderate right neuroforaminal stenosis at C5-C6. Upper chest: Negative. Other: None. IMPRESSION: 1. Moderate degenerative disc disease and facet uncovertebral hypertrophy at C6-C7 resulting in mild central spinal canal stenosis and moderate bilateral neuroforaminal stenosis. 2. Moderate right neuroforaminal stenosis and grade 1 anterolisthesis at C5-C6 due to facet arthropathy. Electronically Signed   By: Obie Dredge M.D.   On: 02/03/2018 13:11   Ct Abdomen Pelvis W Contrast  Result Date: 02/03/2018 CLINICAL DATA:  Pain and nausea EXAM: CT ABDOMEN AND PELVIS WITH CONTRAST TECHNIQUE: Multidetector CT imaging of the abdomen and pelvis was performed using the standard protocol following bolus administration of intravenous contrast. CONTRAST:  ISOVUE-300 IOPAMIDOL (ISOVUE-300) INJECTION 61% COMPARISON:  November 15, 2016 FINDINGS: Lower chest: There is mild bibasilar atelectasis. There is no lung base edema or consolidation. Hepatobiliary: No focal liver lesions are appreciable. Gallbladder is absent. There is no appreciable biliary duct dilatation. Pancreas: No pancreatic mass or inflammatory focus. Spleen: No splenic lesions are appreciable. Adrenals/Urinary Tract: Adrenals bilaterally show no evident mass or hydronephrosis on either side. There is a cyst arising from the medial lower pole of the right kidney measuring 1.4 x 1.3 cm. There is no appreciable hydronephrosis on either side. There is no renal or ureteral calculus on either side. Urinary bladder is midline with wall thickness within normal limits for degree of urinary  bladder distention. Stomach/Bowel: By report, patient has had lap band procedure. No lap band is appreciable on this current examination. There is no appreciable bowel wall or mesenteric thickening. No evident bowel obstruction. No free air or portal venous air. There are proximal sigmoid diverticula without diverticulitis. Vascular/Lymphatic: There is no abdominal aortic aneurysm. There is mild calcification in the aorta. Major mesenteric vessels appear patent. There are subcentimeter inguinal lymph nodes regarded as nonspecific. By size criteria, there is no evident adenopathy in the abdomen or pelvis. Reproductive: Uterus is absent.  There is no pelvic mass. Other: Appendix appears normal. There is no ascites or abscess in the abdomen or pelvis. There is a fairly small ventral hernia containing only fat. Musculoskeletal: There is degenerative change in the lower thoracic and lumbar regions. There is no blastic or lytic bone lesion. There is no intramuscular lesion on either side. IMPRESSION: 1. No bowel wall thickening or bowel obstruction. No abscess. Occasional sigmoid diverticula  without diverticulitis. No lap band seen. 2.  Gallbladder absent.  Uterus absent. 3.  No renal or ureteral calculus.  No hydronephrosis. 4.  Small ventral hernia containing only fat. 5.  Aortic atherosclerosis. Aortic Atherosclerosis (ICD10-I70.0). Electronically Signed   By: Bretta BangWilliam  Woodruff III M.D.   On: 02/03/2018 16:03    Procedures Procedures (including critical care time)  Medications Ordered in ED Medications  morphine 4 MG/ML injection 4 mg (4 mg Intravenous Given 02/03/18 1334)  ondansetron (ZOFRAN) injection 4 mg (4 mg Intravenous Given 02/03/18 1334)  sodium chloride 0.9 % bolus 500 mL (0 mLs Intravenous Stopped 02/03/18 1448)  morphine 4 MG/ML injection 4 mg (4 mg Intravenous Given 02/03/18 1536)  ondansetron (ZOFRAN) injection 4 mg (4 mg Intravenous Given 02/03/18 1536)  iopamidol (ISOVUE-300) 61 % injection (100 mLs   Contrast Given 02/03/18 1527)     Initial Impression / Assessment and Plan / ED Course  I have reviewed the triage vital signs and the nursing notes.  Pertinent labs & imaging results that were available during my care of the patient were reviewed by me and considered in my medical decision making (see chart for details).     Nicole Russell is a 53 y.o. female with a past medical history significant for fibromyalgia, stroke, hypertension, migraines, and several abdominal surgeries in the past including cholecystectomy, gastric banding, hysterectomy, and removal of abdominal adhesions who presents with nausea, vomiting, left-sided chest pain, left shoulder pain, and abdominal pain.  Patient reports that she has been doing well until this morning when she had onset of symptoms.  She reports having shoulder pain initially that radiated into her arm.  She then reported the pain spread towards her lateral left chest and left upper abdomen.  Patient reports nausea and vomiting as well as feeling diaphoretic.  She reports "feeling off" but could not further describe her symptoms.  She described the pain as a 7 out of 10 in severity in the shoulder and chest with abdomen.  She denies any urinary symptoms.  She reports that she has not had a bowel movement today but her last bowel movement yesterday was normal.  She has not taken medicine to help her symptoms.  She denies any palpitations but does report severe shortness of breath.  She reports that it is worsened when she takes a deep breath.  She reports she is having pain in her left neck going down the left arm.  She reports that this is new.  She has had a history of neck pain and shoulder pains from 5 myalgia but this feels different.  She denies other traumas.  On exam, patient has diffuse abdominal tenderness worse in the epigastrium and left upper quadrant.  Patient had CVA tenderness bilaterally.  Patient's lungs were clear.  Chest was nontender.  Left  shoulder was nontender.  Left neck was however tender causing a radicular type pain.  Patient's neurologic exam was unremarkable with normal sensation and upper extremities.  Normal sensation and strength in lower extremities.  Patient had symmetric pupils and no facial droop.  The patient was alert and oriented on my initial evaluation.  Based on symptoms I am concerned about several things.  One being her shoulder pain being referred from diaphragmatic discomfort.  Patient was very tender in her abdomen, will likely have CT imaging of the abdomen to further evaluate.  She will have laboratory testing initially.  Given the patient's shortness of breath and pleuritic pain, a d-dimer will  be added.  If contrast is needed, we will try and get the CT of the chest and abdomen at the same time.  Patient will have chest x-ray as well as CT of the neck given the radicular type pain and symptoms.    Anticipate reassessment after workup.  Patient's laboratory testing returned as seen above.  D-dimer negative.  Lactic acid negative x2.  Initial troponin negative.  CBC and CMP reassuring.  Lipase not elevated.  Chest x-ray shows no pneumonia or other cardio pulmonary abnormality.  CT of the cervical spine shows degenerative disease but no acute fracture or dislocations.  Next  Due to the patient's severe abdominal tenderness and pain, CT of the abdomen and pelvis was ordered.  Anticipate reassessment after imaging.  CT of the abdomen pelvis was reassuring with no acute findings.  Patient was reassessed and had some improvement in her symptoms.  Troponin was negative x2.  In the setting of a heart score 3, patient was felt to be safe for discharge home.  Patient had negative d-dimer.  Patient's workup was overall reassuring with no evidence of infections.   Given the patient's pain in the neck and shoulder and arm I suspect she is having radicular type pain in the setting of the degenerative disease was found on  CT.  Patient will follow up with spine team for further management.  Patient will follow with PCP for further management of her abdominal pain and chest pain.  Patient understood return precautions and was given prescription for pain medication.  Patient had no other worsens or concerns and was discharged in good condition.    Final Clinical Impressions(s) / ED Diagnoses   Final diagnoses:  Radicular pain in left arm  Acute pain of left shoulder  Neck pain  Generalized abdominal pain    ED Discharge Orders        Ordered    oxyCODONE-acetaminophen (PERCOCET/ROXICET) 5-325 MG tablet  Every 4 hours PRN     02/03/18 1628      Clinical Impression: 1. Radicular pain in left arm   2. Acute pain of left shoulder   3. Neck pain   4. Generalized abdominal pain     Disposition: Discharge  Condition: Good  I have discussed the results, Dx and Tx plan with the pt(& family if present). He/she/they expressed understanding and agree(s) with the plan. Discharge instructions discussed at great length. Strict return precautions discussed and pt &/or family have verbalized understanding of the instructions. No further questions at time of discharge.    New Prescriptions   OXYCODONE-ACETAMINOPHEN (PERCOCET/ROXICET) 5-325 MG TABLET    Take 1 tablet by mouth every 4 (four) hours as needed for severe pain.    Follow Up: Tia Alert, MD [address] [place] [address] [phone]     [facility] EMERGENCY DEPARTMENT [address] [id number] mc [place] [address] [phone]  If symptoms worsen  Leola Brazil, DO [address] [place] [address] [phone]        Merlin Golden, Canary Brim, MD 02/03/18 2032

## 2018-02-03 NOTE — ED Notes (Signed)
Pt stable, ambulatory, and verbalizes understanding of d/c instructions.  

## 2018-02-04 LAB — URINE CULTURE

## 2018-04-02 DIAGNOSIS — M5030 Other cervical disc degeneration, unspecified cervical region: Secondary | ICD-10-CM | POA: Insufficient documentation

## 2018-04-21 DIAGNOSIS — E46 Unspecified protein-calorie malnutrition: Secondary | ICD-10-CM | POA: Insufficient documentation

## 2018-04-22 DIAGNOSIS — H35033 Hypertensive retinopathy, bilateral: Secondary | ICD-10-CM | POA: Insufficient documentation

## 2018-04-22 DIAGNOSIS — H35413 Lattice degeneration of retina, bilateral: Secondary | ICD-10-CM | POA: Insufficient documentation

## 2018-05-01 DIAGNOSIS — Z6841 Body Mass Index (BMI) 40.0 and over, adult: Secondary | ICD-10-CM | POA: Insufficient documentation

## 2018-10-29 DIAGNOSIS — Z780 Asymptomatic menopausal state: Secondary | ICD-10-CM | POA: Insufficient documentation

## 2019-01-09 IMAGING — DX DG CHEST 2V
2 series · 2 of 2 positions shown · non-contrast
Comparison: 05/30/2017

CLINICAL DATA: Midline left upper back pain.  Shortness of breath

EXAM:
CHEST  2 VIEW

[chest lat]
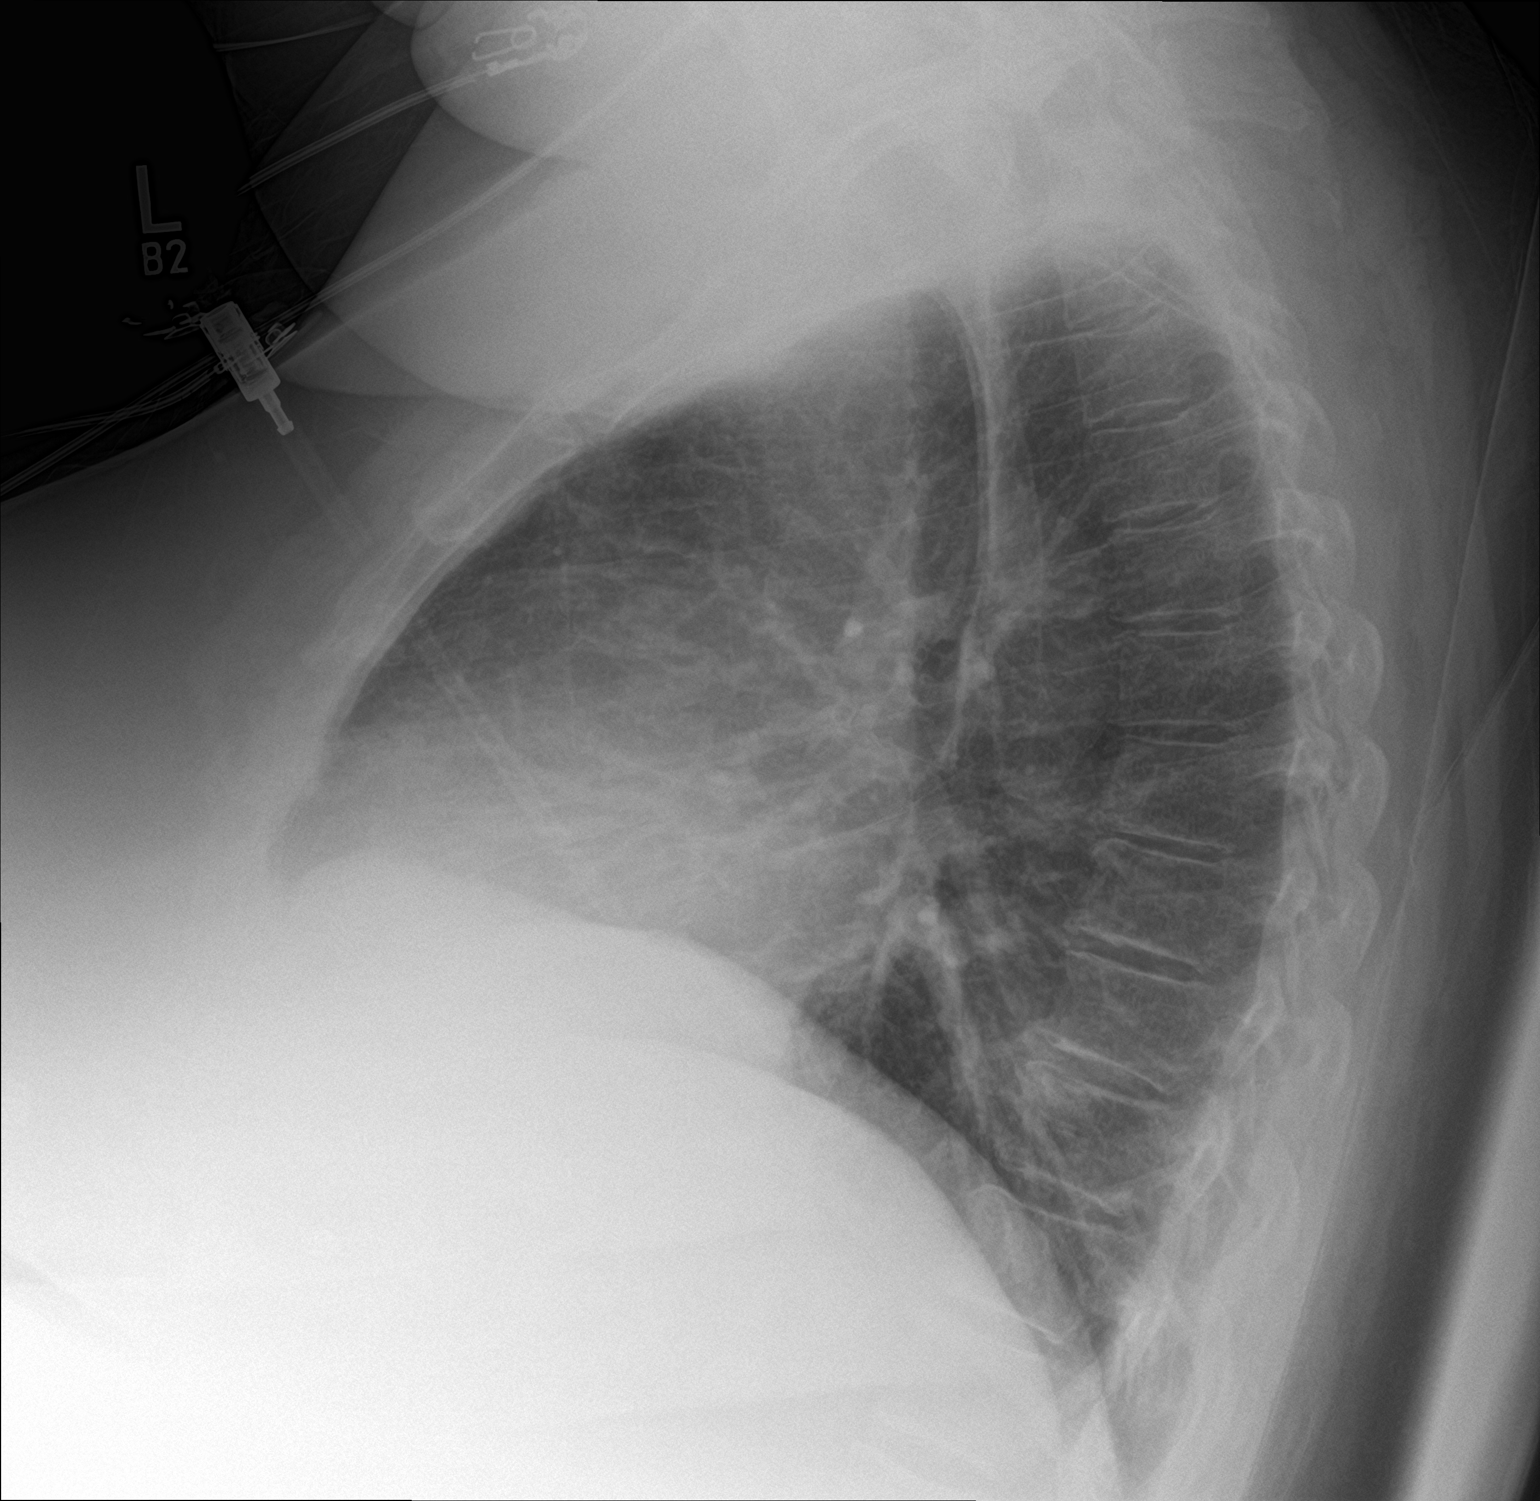

[chest ap]
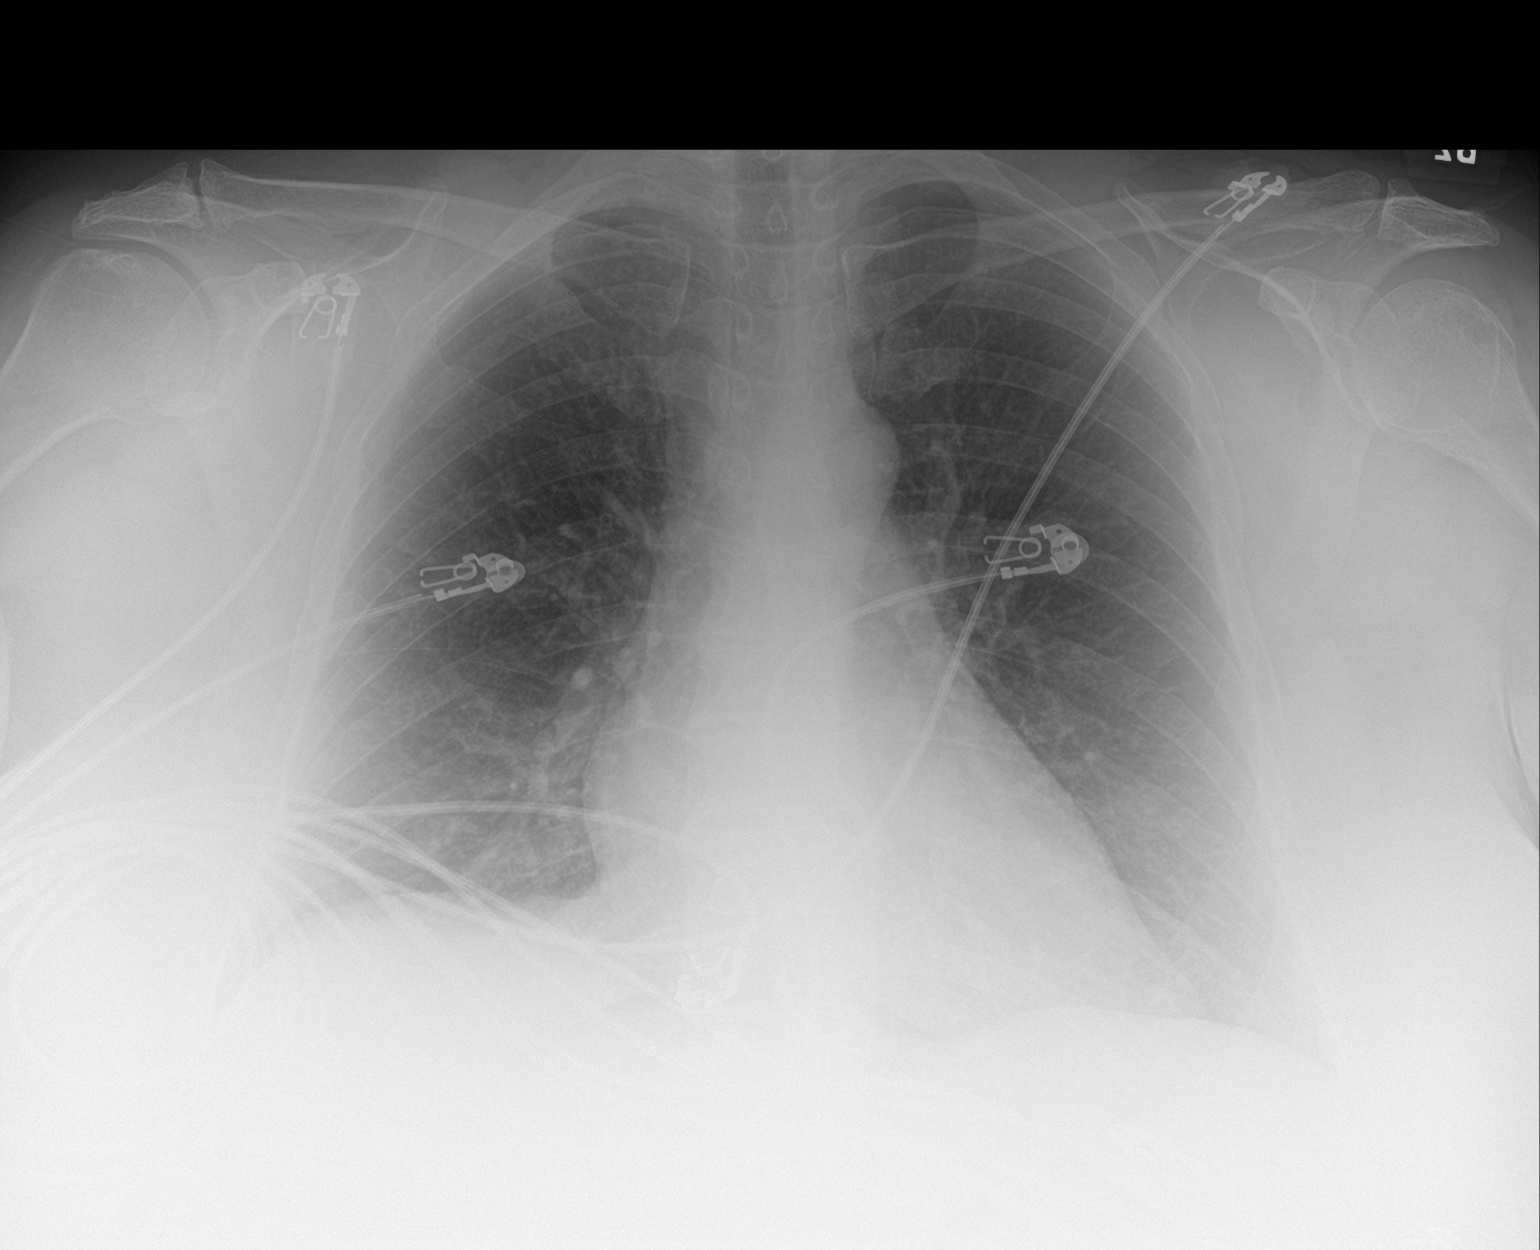

[2 of 2 positions shown; findings below may reference images not displayed]

FINDINGS: Heart and mediastinal contours are within normal limits. No focal
opacities or effusions. No acute bony abnormality.
IMPRESSION: No active cardiopulmonary disease.

## 2019-01-09 IMAGING — CT CT ABD-PELV W/ CM
2 of 5 series · 16 of 46 positions shown, 18 images · IV contrast (APPLIED)
Comparison: November 15, 2016

CLINICAL DATA: Pain and nausea

EXAM:
CT ABDOMEN AND PELVIS WITH CONTRAST
TECHNIQUE: Multidetector CT imaging of the abdomen and pelvis was performed
using the standard protocol following bolus administration of
intravenous contrast.
CONTRAST:  100mL H1SXZ3-M22 IOPAMIDOL (H1SXZ3-M22) INJECTION 61%

[Series 3: abdomen 5.0 · axial · 0.93mm/px · z∈[-582,-176]mm · 13 of 95 slices shown, 15 images]
[im 7/95  soft-tissue]
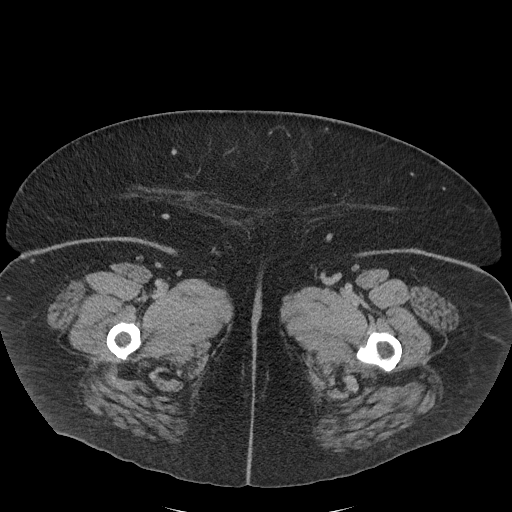
[im 7/95  bone]
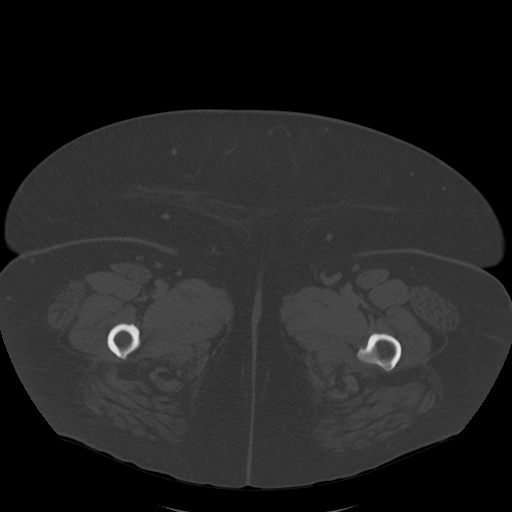
[im 14/95  soft-tissue]
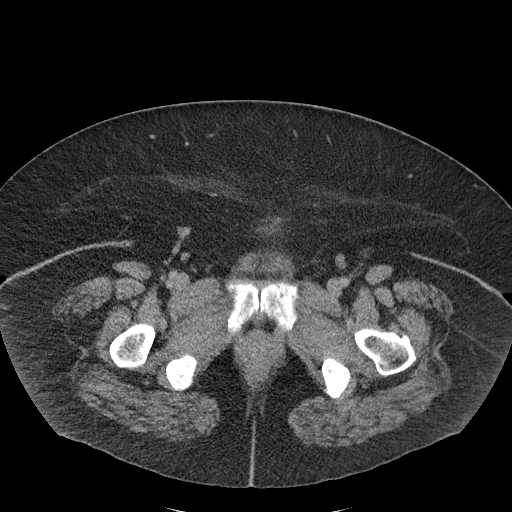
[im 21/95  soft-tissue]
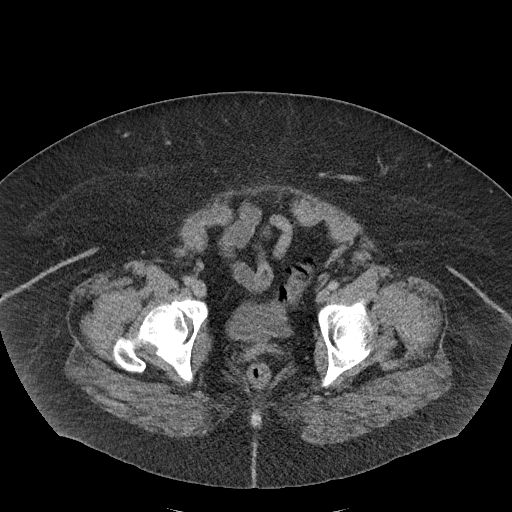
[im 27/95  soft-tissue]
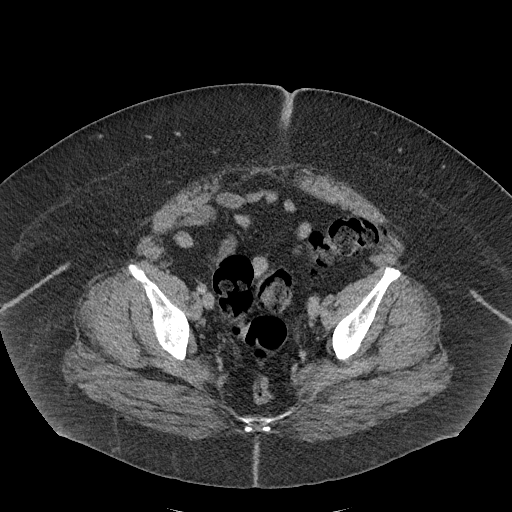
[im 34/95  soft-tissue]
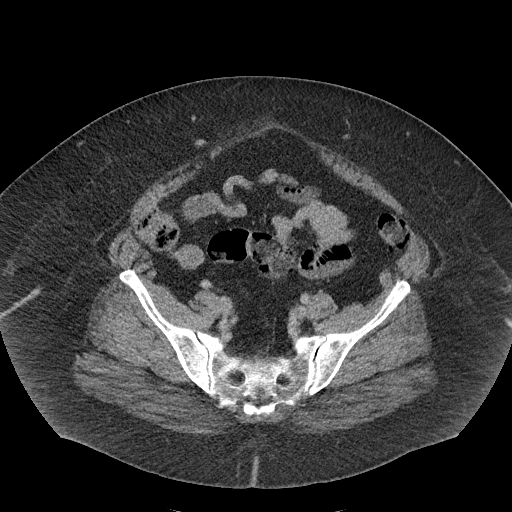
[im 41/95  soft-tissue]
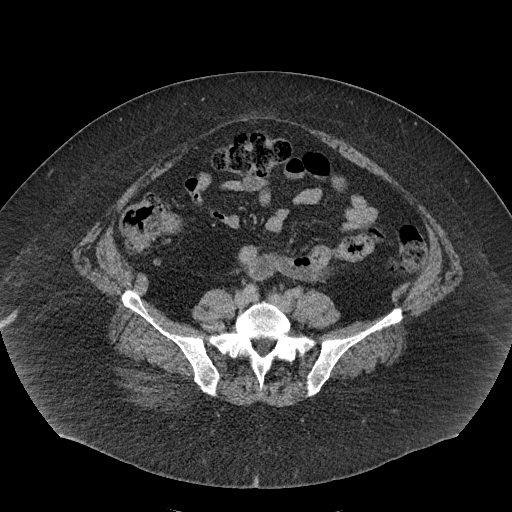
[im 48/95  soft-tissue]
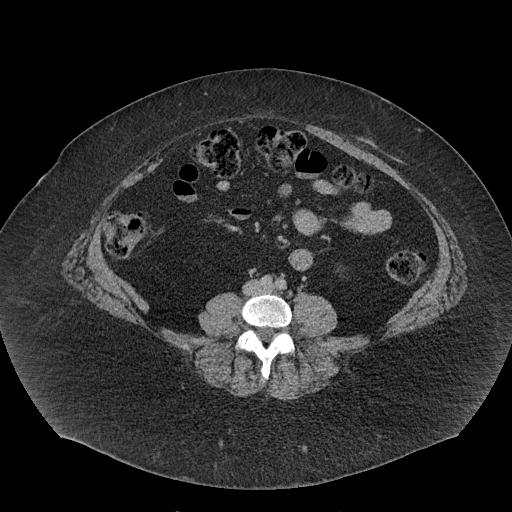
[im 54/95  soft-tissue]
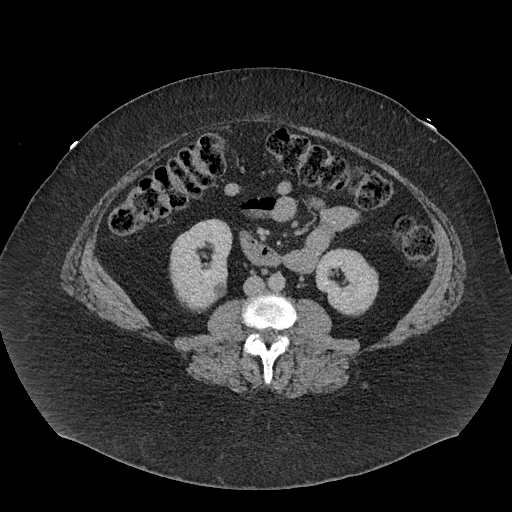
[im 61/95  soft-tissue]
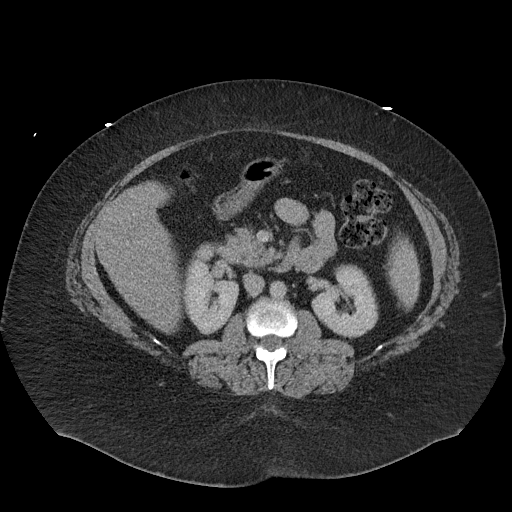
[im 61/95  bone]
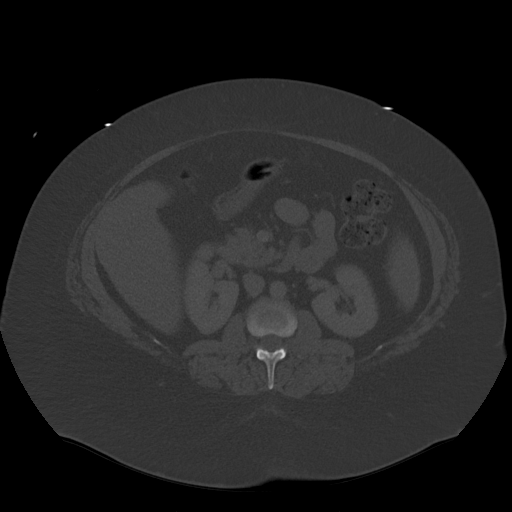
[im 68/95  soft-tissue]
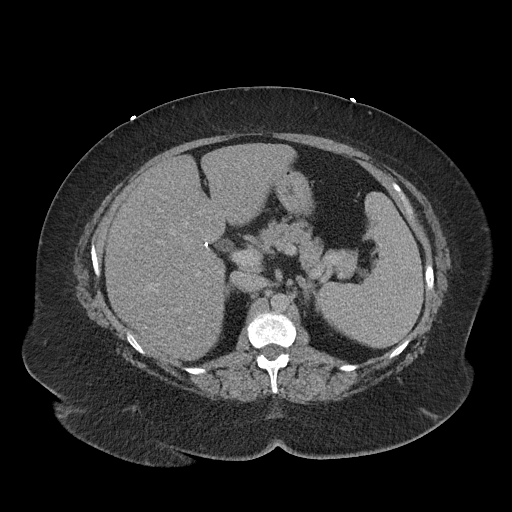
[im 74/95  soft-tissue]
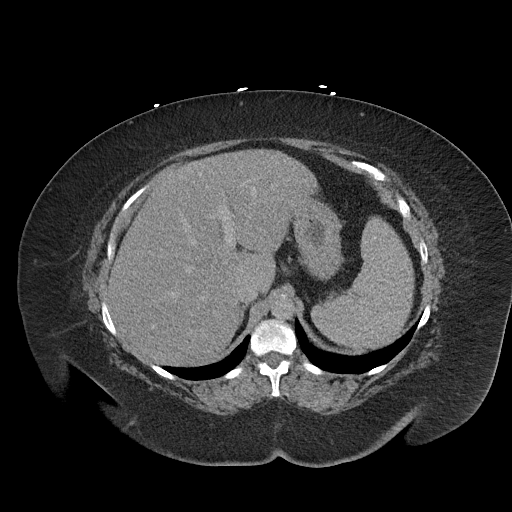
[im 81/95  soft-tissue]
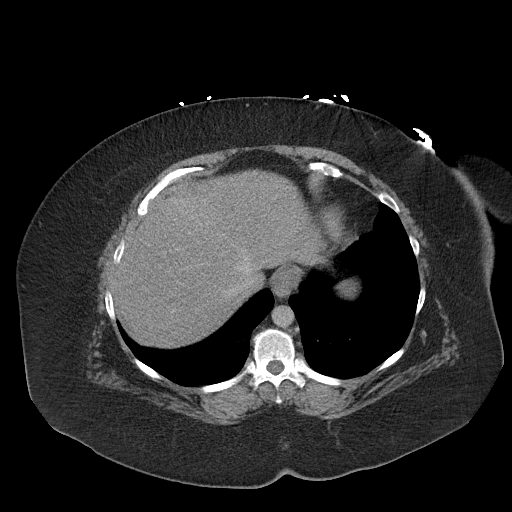
[im 88/95  soft-tissue]
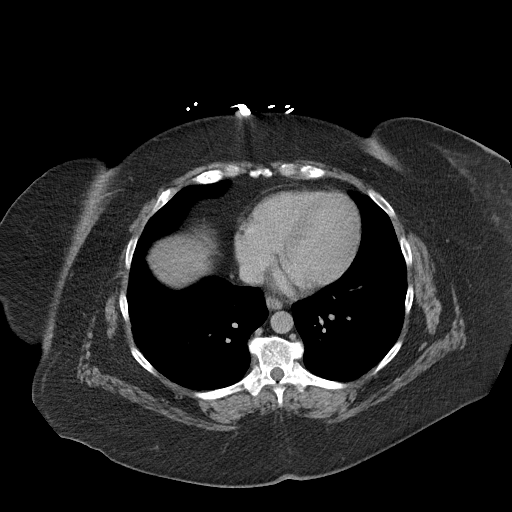

[Series 6: abdomen 3.0 mpr cor · coronal · 0.92mm/px · 3 of 101 slices shown]
[im 34/101  soft-tissue]
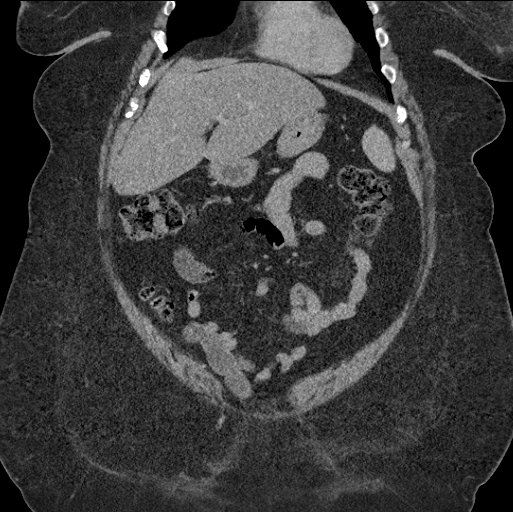
[im 45/101  soft-tissue]
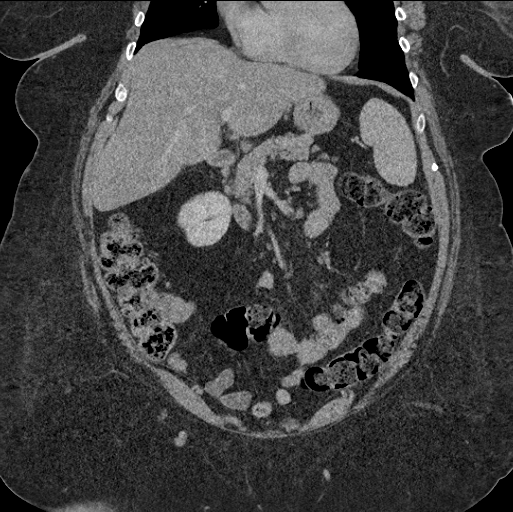
[im 56/101  soft-tissue]
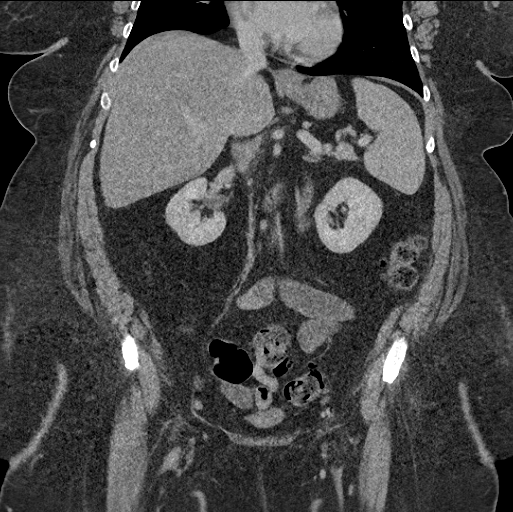

[16 of 46 positions shown; findings below may reference images not displayed]

FINDINGS: Lower chest: There is mild bibasilar atelectasis. There is no lung
base edema or consolidation.

Hepatobiliary: No focal liver lesions are appreciable. Gallbladder
is absent. There is no appreciable biliary duct dilatation.

Pancreas: No pancreatic mass or inflammatory focus.

Spleen: No splenic lesions are appreciable.

Adrenals/Urinary Tract: Adrenals bilaterally show no evident mass or
hydronephrosis on either side. There is a cyst arising from the
medial lower pole of the right kidney measuring 1.4 x 1.3 cm. There
is no appreciable hydronephrosis on either side. There is no renal
or ureteral calculus on either side. Urinary bladder is midline with
wall thickness within normal limits for degree of urinary bladder
distention.

Stomach/Bowel: By report, patient has had lap band procedure. No lap
band is appreciable on this current examination. There is no
appreciable bowel wall or mesenteric thickening. No evident bowel
obstruction. No free air or portal venous air. There are proximal
sigmoid diverticula without diverticulitis.

Vascular/Lymphatic: There is no abdominal aortic aneurysm. There is
mild calcification in the aorta. Major mesenteric vessels appear
patent. There are subcentimeter inguinal lymph nodes regarded as
nonspecific. By size criteria, there is no evident adenopathy in the
abdomen or pelvis.

Reproductive: Uterus is absent.  There is no pelvic mass.

Other: Appendix appears normal. There is no ascites or abscess in
the abdomen or pelvis. There is a fairly small ventral hernia
containing only fat.

Musculoskeletal: There is degenerative change in the lower thoracic
and lumbar regions. There is no blastic or lytic bone lesion. There
is no intramuscular lesion on either side.
IMPRESSION: 1. No bowel wall thickening or bowel obstruction. No abscess.
Occasional sigmoid diverticula without diverticulitis. No lap band
seen.

2.  Gallbladder absent.  Uterus absent.

3.  No renal or ureteral calculus.  No hydronephrosis.

4.  Small ventral hernia containing only fat.

5.  Aortic atherosclerosis.

Aortic Atherosclerosis ([id number].T).

## 2019-04-20 DIAGNOSIS — M5134 Other intervertebral disc degeneration, thoracic region: Secondary | ICD-10-CM | POA: Insufficient documentation

## 2019-04-20 DIAGNOSIS — M47812 Spondylosis without myelopathy or radiculopathy, cervical region: Secondary | ICD-10-CM | POA: Insufficient documentation

## 2019-04-20 DIAGNOSIS — M47816 Spondylosis without myelopathy or radiculopathy, lumbar region: Secondary | ICD-10-CM | POA: Insufficient documentation

## 2019-04-20 DIAGNOSIS — M5412 Radiculopathy, cervical region: Secondary | ICD-10-CM | POA: Insufficient documentation

## 2019-04-20 DIAGNOSIS — M51369 Other intervertebral disc degeneration, lumbar region without mention of lumbar back pain or lower extremity pain: Secondary | ICD-10-CM | POA: Insufficient documentation

## 2019-10-13 ENCOUNTER — Other Ambulatory Visit: Payer: Self-pay

## 2019-10-13 ENCOUNTER — Emergency Department (HOSPITAL_BASED_OUTPATIENT_CLINIC_OR_DEPARTMENT_OTHER): Payer: 59

## 2019-10-13 ENCOUNTER — Emergency Department (HOSPITAL_BASED_OUTPATIENT_CLINIC_OR_DEPARTMENT_OTHER)
Admission: EM | Admit: 2019-10-13 | Discharge: 2019-10-13 | Disposition: A | Discharge status: Home / Self Care | Payer: 59 | Attending: Emergency Medicine | Admitting: Emergency Medicine

## 2019-10-13 ENCOUNTER — Encounter (HOSPITAL_BASED_OUTPATIENT_CLINIC_OR_DEPARTMENT_OTHER): Payer: Self-pay | Admitting: *Deleted

## 2019-10-13 DIAGNOSIS — Z96659 Presence of unspecified artificial knee joint: Secondary | ICD-10-CM | POA: Insufficient documentation

## 2019-10-13 DIAGNOSIS — Z79899 Other long term (current) drug therapy: Secondary | ICD-10-CM | POA: Diagnosis not present

## 2019-10-13 DIAGNOSIS — I10 Essential (primary) hypertension: Secondary | ICD-10-CM | POA: Diagnosis not present

## 2019-10-13 DIAGNOSIS — R1032 Left lower quadrant pain: Secondary | ICD-10-CM | POA: Insufficient documentation

## 2019-10-13 DIAGNOSIS — R109 Unspecified abdominal pain: Secondary | ICD-10-CM

## 2019-10-13 DIAGNOSIS — Z7722 Contact with and (suspected) exposure to environmental tobacco smoke (acute) (chronic): Secondary | ICD-10-CM | POA: Insufficient documentation

## 2019-10-13 DIAGNOSIS — Z7982 Long term (current) use of aspirin: Secondary | ICD-10-CM | POA: Insufficient documentation

## 2019-10-13 HISTORY — DX: Calculus of kidney: N20.0

## 2019-10-13 LAB — COMPREHENSIVE METABOLIC PANEL
ALT: 26 U/L (ref 0–44)
AST: 22 U/L (ref 15–41)
Albumin: 4.4 g/dL (ref 3.5–5.0)
Alkaline Phosphatase: 89 U/L (ref 38–126)
Anion gap: 13 (ref 5–15)
BUN: 14 mg/dL (ref 6–20)
CO2: 26 mmol/L (ref 22–32)
Calcium: 9.5 mg/dL (ref 8.9–10.3)
Chloride: 99 mmol/L (ref 98–111)
Creatinine, Ser: 0.7 mg/dL (ref 0.44–1.00)
GFR calc Af Amer: >60 mL/min (ref >60)
GFR calc non Af Amer: >60 mL/min (ref >60)
Glucose, Bld: 96 mg/dL (ref 70–99)
Potassium: 3.6 mmol/L (ref 3.5–5.1)
Sodium: 138 mmol/L (ref 135–145)
Total Bilirubin: 0.6 mg/dL (ref 0.3–1.2)
Total Protein: 7.7 g/dL (ref 6.5–8.1)

## 2019-10-13 LAB — CBC WITH DIFFERENTIAL/PLATELET
Abs Immature Granulocytes: 0.07 10*3/uL (ref 0.00–0.07)
Basophils Absolute: 0.1 10*3/uL (ref 0.0–0.1)
Basophils Relative: 1 %
Eosinophils Absolute: 0.3 10*3/uL (ref 0.0–0.5)
Eosinophils Relative: 3 %
HCT: 44.7 % (ref 36.0–46.0)
Hemoglobin: 13.9 g/dL (ref 12.0–15.0)
Immature Granulocytes: 1 %
Lymphocytes Relative: 22 %
Lymphs Abs: 2.4 10*3/uL (ref 0.7–4.0)
MCH: 27.4 pg (ref 26.0–34.0)
MCHC: 31.1 g/dL (ref 30.0–36.0)
MCV: 88 fL (ref 80.0–100.0)
Monocytes Absolute: 0.7 10*3/uL (ref 0.1–1.0)
Monocytes Relative: 6 %
Neutro Abs: 7.7 10*3/uL (ref 1.7–7.7)
Neutrophils Relative %: 67 %
Platelets: 254 10*3/uL (ref 150–400)
RBC: 5.08 MIL/uL (ref 3.87–5.11)
RDW: 13.2 % (ref 11.5–15.5)
WBC: 11.2 10*3/uL — ABNORMAL HIGH (ref 4.0–10.5)
nRBC: 0 % (ref 0.0–0.2)

## 2019-10-13 LAB — URINALYSIS, ROUTINE W REFLEX MICROSCOPIC
Bilirubin Urine: NEGATIVE
Glucose, UA: NEGATIVE mg/dL
Ketones, ur: NEGATIVE mg/dL
Nitrite: NEGATIVE
Protein, ur: NEGATIVE mg/dL
Specific Gravity, Urine: 1.015 (ref 1.005–1.030)
pH: 6 (ref 5.0–8.0)

## 2019-10-13 LAB — URINALYSIS, MICROSCOPIC (REFLEX)

## 2019-10-13 LAB — LIPASE, BLOOD: Lipase: 25 U/L (ref 11–51)

## 2019-10-13 MED ORDER — ONDANSETRON 4 MG PO TBDP: ORAL_TABLET | ORAL | 0 refills | Status: DC

## 2019-10-13 MED ORDER — ONDANSETRON HCL 4 MG/2ML IJ SOLN
4.0000 mg | Freq: Once | INTRAMUSCULAR | Status: AC
  Administered 2019-10-13: 22:00:00 4 mg via INTRAVENOUS
  Filled 2019-10-13: qty 2

## 2019-10-13 MED ORDER — SODIUM CHLORIDE 0.9 % IV BOLUS
1000.0000 mL | Freq: Once | INTRAVENOUS | Status: AC
  Administered 2019-10-13: 1000 mL via INTRAVENOUS

## 2019-10-13 MED ORDER — MORPHINE SULFATE (PF) 4 MG/ML IV SOLN
4.0000 mg | Freq: Once | INTRAVENOUS | Status: AC
  Administered 2019-10-13: 22:00:00 4 mg via INTRAVENOUS
  Filled 2019-10-13: qty 1

## 2019-10-13 MED ORDER — ONDANSETRON HCL 4 MG/2ML IJ SOLN
4.0000 mg | Freq: Once | INTRAMUSCULAR | Status: AC
  Administered 2019-10-13: 20:00:00 4 mg via INTRAVENOUS

## 2019-10-13 MED ORDER — ONDANSETRON HCL 4 MG/2ML IJ SOLN
INTRAMUSCULAR | Status: AC
  Filled 2019-10-13: qty 2

## 2019-10-13 MED ORDER — MORPHINE SULFATE (PF) 4 MG/ML IV SOLN
4.0000 mg | Freq: Once | INTRAVENOUS | Status: AC
  Administered 2019-10-13: 4 mg via INTRAVENOUS
  Filled 2019-10-13: qty 1

## 2019-10-13 NOTE — ED Provider Notes (Signed)
[facility] EMERGENCY DEPARTMENT Provider Note   CSN: [id number] Arrival date & time: 10/13/19  1802     History   Chief Complaint Chief Complaint  Patient presents with   Flank Pain    HPI Nicole Russell is a 54 y.o. female.     84 yoF with a chief complaint of left flank pain.  This been going on for about 3 days now.  Feels like when she had a kidney stone in the past.  Pain seems to be getting progressively worse.  Has been constant.  Has had some nausea today.  Denies fevers or chills denies vomiting denies diarrhea.  Has had diverticulitis in the past as well.  No known sick contacts.  The history is provided by the patient.  Flank Pain This is a new problem. The current episode started 2 days ago. The problem occurs constantly. The problem has been gradually worsening. Associated symptoms include abdominal pain. Pertinent negatives include no chest pain, no headaches and no shortness of breath. Nothing aggravates the symptoms. Nothing relieves the symptoms. She has tried nothing for the symptoms.    Past Medical History:  Diagnosis Date   CVA (cerebral vascular accident) ([place])    Fibromyalgia    Hypertension    Kidney stones    Migraines     There are no active problems to display for this patient.   Past Surgical History:  Procedure Laterality Date   ABDOMINAL HYSTERECTOMY     CHOLECYSTECTOMY     KNEE SURGERY     LAPAROSCOPIC GASTRIC BANDING     NASAL SINUS SURGERY     TOTAL KNEE ARTHROPLASTY       OB History   No obstetric history on file.      Home Medications    Prior to Admission medications   Medication Sig Start Date End Date Taking? Authorizing Provider  aspirin 81 MG tablet Take 81 mg by mouth daily.    [provider]  BuPROPion HCl (WELLBUTRIN PO) Take by mouth.    [provider]  cephALEXin (KEFLEX) 500 MG capsule Take 1 capsule (500 mg total) by mouth 4 (four) times daily. 12/21/13   Serita Grit, MD  HYDROcodone-acetaminophen (NORCO) 5-325 MG tablet Take 1-2 tablets by mouth every 6 (six) hours as needed. 05/30/17   Veryl Speak, MD  LISINOPRIL PO Take by mouth.    [provider]  meloxicam (MOBIC) 15 MG tablet Take 15 mg by mouth daily.    [provider]  Montelukast Sodium (SINGULAIR PO) Take by mouth.    [provider]  ondansetron (ZOFRAN ODT) 4 MG disintegrating tablet 4mg  ODT q4 hours prn nausea/vomit 10/13/19   Deno Etienne, DO  oxyCODONE-acetaminophen (PERCOCET/ROXICET) 5-325 MG tablet Take 1 tablet by mouth every 4 (four) hours as needed for severe pain. 02/03/18   Tegeler, Gwenyth Allegra, MD  rOPINIRole (REQUIP) 0.5 MG tablet Take 0.5 mg by mouth 3 (three) times daily.    [provider]  Sertraline HCl (ZOLOFT PO) Take by mouth.    [provider]  trimethobenzamide (TIGAN) 300 MG capsule Take 300 mg by mouth every morning.    [provider]  venlafaxine XR (EFFEXOR-XR) 150 MG 24 hr capsule Take 150 mg by mouth daily with breakfast.    [provider]    Family History No family history on file.  Social History Social History   Tobacco Use   Smoking status: Passive Smoke Exposure - Never Smoker  Smokeless tobacco: Never Used  Substance Use Topics   Alcohol use: Yes   Drug use: No     Allergies   Patient has no known allergies.   Review of Systems Review of Systems  Constitutional: Negative for chills and fever.  HENT: Negative for congestion and rhinorrhea.   Eyes: Negative for redness and visual disturbance.  Respiratory: Negative for shortness of breath and wheezing.   Cardiovascular: Negative for chest pain and palpitations.  Gastrointestinal: Positive for abdominal pain. Negative for nausea and vomiting.  Genitourinary: Positive for flank pain. Negative for dysuria and urgency.  Musculoskeletal: Negative for arthralgias and myalgias.  Skin: Negative for pallor and wound.    Neurological: Negative for dizziness and headaches.  54   Physical Exam Updated Vital Signs BP 139/62 (BP Location: Left Arm)    Pulse 69    Resp 18    Ht  (1.549 m)    Wt 128.8 kg    SpO2 100%    BMI 53.66 kg/m   Physical Exam Vitals signs and nursing note reviewed.  Constitutional:      General: She is not in acute distress.    Appearance: She is well-developed. She is not diaphoretic.  HENT:     Head: Normocephalic and atraumatic.  Eyes:     Pupils: Pupils are equal, round, and reactive to light.  Neck:     Musculoskeletal: Normal range of motion and neck supple.  Cardiovascular:     Rate and Rhythm: Normal rate and regular rhythm.     Heart sounds: No murmur. No friction rub. No gallop.   Pulmonary:     Effort: Pulmonary effort is normal.     Breath sounds: No wheezing or rales.  Abdominal:     General: There is no distension.     Palpations: Abdomen is soft.     Tenderness: There is no abdominal tenderness.     Comments: Diffusely tender about the abdomen but worst on the left upper and left lower quadrant.  Musculoskeletal:        General: No tenderness.  Skin:    General: Skin is warm and dry.  Neurological:     Mental Status: She is alert and oriented to person, place, and time.  Psychiatric:        Behavior: Behavior normal.      ED Treatments / Results  Labs (all labs ordered are listed, but only abnormal results are displayed) Labs Reviewed  URINALYSIS, ROUTINE W REFLEX MICROSCOPIC - Abnormal; Notable for the following components:      Result Value   Hgb urine dipstick MODERATE (*)    Leukocytes,Ua SMALL (*)    All other components within normal limits  CBC WITH DIFFERENTIAL/PLATELET - Abnormal; Notable for the following components:   WBC 11.2 (*)    All other components within normal limits  URINALYSIS, MICROSCOPIC (REFLEX) - Abnormal; Notable for the following components:   Bacteria, UA FEW (*)    All other components within normal limits   COMPREHENSIVE METABOLIC PANEL  LIPASE, BLOOD    EKG None  Radiology Ct Renal Stone Study  Result Date: 10/13/2019 CLINICAL DATA:  54 year old female with left flank pain and nausea. EXAM: CT ABDOMEN AND PELVIS WITHOUT CONTRAST TECHNIQUE: Multidetector CT imaging of the abdomen and pelvis was performed following the standard protocol without IV contrast. COMPARISON:  CT of the abdomen pelvis dated 02/03/2018 FINDINGS: Evaluation of this exam is limited in the absence of intravenous contrast. Lower chest: The  visualized lung bases are clear. No intra-abdominal free air or free fluid. Hepatobiliary: There is diffuse fatty infiltration of the liver. No intrahepatic biliary ductal dilatation. Cholecystectomy. No retained calcified stone noted in the central CBD. Pancreas: Unremarkable. No pancreatic ductal dilatation or surrounding inflammatory changes. Spleen: Mild splenomegaly measuring up to 15 cm in length. Adrenals/Urinary Tract: The adrenal glands are unremarkable. There is no hydronephrosis or nephrolithiasis on either side. An ill-defined partially exophytic 12 mm hypodense lesion from the inferior pole the right kidney is not well characterized on this noncontrast CT but appears similar to prior CT most consistent with a cyst. The visualized ureters appear unremarkable. The urinary bladder is collapsed. Stomach/Bowel: There is sigmoid diverticulosis without active inflammatory changes. Small scattered colonic diverticula without active inflammatory changes. Postsurgical changes of partial gastrectomy or gastric sleeve. There is a small hiatal hernia. There is no bowel obstruction or active inflammation. The appendix is normal. Vascular/Lymphatic: The abdominal aorta and IVC are grossly unremarkable this noncontrast CT. No portal venous gas. There is no adenopathy. Reproductive: Hysterectomy. No pelvic mass Other: None Musculoskeletal: Degenerative changes of the spine. No acute osseous pathology.  IMPRESSION: 1. No acute intra-abdominal or pelvic pathology. No hydronephrosis or nephrolithiasis. 2. Fatty liver. 3. Mild splenomegaly. 4. Colonic diverticulosis. No bowel obstruction or active inflammation. Normal appendix. Electronically Signed   By: Elgie CollardArash  Radparvar M.D.   On: 10/13/2019 20:42    Procedures Procedures (including critical care time)  Medications Ordered in ED Medications  ondansetron (ZOFRAN) injection 4 mg (4 mg Intravenous Given 10/13/19 2004)  morphine 4 MG/ML injection 4 mg (4 mg Intravenous Given 10/13/19 2019)  sodium chloride 0.9 % bolus 1,000 mL (1,000 mLs Intravenous New Bag/Given 10/13/19 2018)  morphine 4 MG/ML injection 4 mg (4 mg Intravenous Given 10/13/19 2151)  ondansetron (ZOFRAN) injection 4 mg (4 mg Intravenous Given 10/13/19 2151)     Initial Impression / Assessment and Plan / ED Course  I have reviewed the triage vital signs and the nursing notes.  Pertinent labs & imaging results that were available during my care of the patient were reviewed by me and considered in my medical decision making (see chart for details).        54 yo F with a chief complaints of left-sided abdominal pain.  Going on for the past 3 days.  Was seen in urgent care today and told to come here for CT imaging.  Patient does appear to be in some severe discomfort.  Will obtain lab work bolus IV fluids pain meds and a CT scan.  CT scan is negative for acute pathology.  No hydronephrosis no kidney stones.  Lab work with a mild leukocytosis, UA negative for infection, LFTs and lipase are negative.  I discussed the results with the patient.  We will have her follow-up with her PCP.  9:56 PM:  I have discussed the diagnosis/risks/treatment options with the patient and believe the pt to be eligible for discharge home to follow-up with PCP. We also discussed returning to the ED immediately if new or worsening sx occur. We discussed the sx which are most concerning (e.g., sudden  worsening pain, fever, inability to tolerate by mouth) that necessitate immediate return. Medications administered to the patient during their visit and any new prescriptions provided to the patient are listed below.  Medications given during this visit Medications  ondansetron (ZOFRAN) injection 4 mg (4 mg Intravenous Given 10/13/19 2004)  morphine 4 MG/ML injection 4 mg (4 mg Intravenous Given  10/13/19 2019)  sodium chloride 0.9 % bolus 1,000 mL (1,000 mLs Intravenous New Bag/Given 10/13/19 2018)  morphine 4 MG/ML injection 4 mg (4 mg Intravenous Given 10/13/19 2151)  ondansetron (ZOFRAN) injection 4 mg (4 mg Intravenous Given 10/13/19 2151)     The patient appears reasonably screen and/or stabilized for discharge and I doubt any other medical condition or other [facility] requiring further screening, evaluation, or treatment in the ED at this time prior to discharge.    Final Clinical Impressions(s) / ED Diagnoses   Final diagnoses:  Left flank pain    ED Discharge Orders         Ordered    ondansetron (ZOFRAN ODT) 4 MG disintegrating tablet     10/13/19 2147           Melene Plan, DO 10/13/19 2156

## 2019-10-13 NOTE — Discharge Instructions (Signed)
As discussed return for worsening pain, fever or inability to eat or drink.

## 2019-10-13 NOTE — ED Triage Notes (Signed)
Pt c/o left flank pain x 3 days , sent here from [facility] for ct scan

## 2019-10-13 NOTE — ED Notes (Signed)
Patient transported to CT 

## 2020-02-08 DIAGNOSIS — Z96652 Presence of left artificial knee joint: Secondary | ICD-10-CM | POA: Insufficient documentation

## 2020-05-17 ENCOUNTER — Ambulatory Visit: Payer: 59 | Admitting: Neurology

## 2020-05-19 ENCOUNTER — Encounter: Payer: Self-pay | Admitting: Neurology

## 2020-05-19 ENCOUNTER — Ambulatory Visit (INDEPENDENT_AMBULATORY_CARE_PROVIDER_SITE_OTHER): Payer: POS | Admitting: Neurology

## 2020-05-19 ENCOUNTER — Other Ambulatory Visit: Payer: Self-pay

## 2020-05-19 VITALS — BP 151/84 | HR 74 | Ht 62.0 in | Wt 288.0 lb

## 2020-05-19 DIAGNOSIS — M797 Fibromyalgia: Secondary | ICD-10-CM

## 2020-05-19 DIAGNOSIS — R200 Anesthesia of skin: Secondary | ICD-10-CM | POA: Diagnosis not present

## 2020-05-19 DIAGNOSIS — Z9884 Bariatric surgery status: Secondary | ICD-10-CM | POA: Diagnosis not present

## 2020-05-19 DIAGNOSIS — H539 Unspecified visual disturbance: Secondary | ICD-10-CM

## 2020-05-19 DIAGNOSIS — R4789 Other speech disturbances: Secondary | ICD-10-CM | POA: Insufficient documentation

## 2020-05-19 MED ORDER — ALPRAZOLAM 0.5 MG PO TABS: ORAL_TABLET | ORAL | 0 refills | Status: DC

## 2020-05-19 MED ORDER — NABUMETONE 500 MG PO TABS: 500.0000 mg | ORAL_TABLET | Freq: Two times a day (BID) | ORAL | 3 refills | Status: DC

## 2020-05-19 NOTE — Progress Notes (Signed)
[facility]  PATIENT: Nicole Russell DOB: [birth year]  REFERRING DOCTOR OR PCP: Hope Pigeon, DO SOURCE: Patient, notes from primary care, imaging reports, lab reports, MRI images  _________________________________   HISTORICAL  CHIEF COMPLAINT:  Chief Complaint  Patient presents with  . New Patient (Initial Visit)    RM 12, alone. Paper referral for muscular pain, word finding difficulty, hand weakness, unsteady gait. Has annual eye exam in July. Watching her left eye for retinal detachment. Also has cataracts.     HISTORY OF PRESENT ILLNESS:  I had the pleasure of seeing patient, Nicole Russell, at [facility] for neurologic consultation regarding her visual changes, difficulties with verbal fluency, myalgias and intermittent weakness.  She is a 55 year old woman who has noted muscle weakness, visual changes and reduced verbal fluency intermittently over the past year.   Additionally, she is having more difficulty with focus and attention.   She is more moody.   He weakness is intermittent and she notes it most in the leg.  Specifically, she notes when she stands up in the morning that a leg (either) sometimes gives out.    She denies weakness in the arms.  She has known degenerative changes in the cervical spinal cord.  There is no myelopathic signal or significant spinal stenosis.   She has a h/o fibromyalgia and has muscle aches.   She also has sciatica and migraines.    She was seeing Dr, Alton Revere in [facility] for a few years.   She is on duloxetine 90 mg, Wellbutrin 150 mg po bid, tizanidine 4 mg and tramadol 50 mg as needed.  Gabapentin had not helped.   Last year, she had a NCV/EMG but we do not have the results.  She reports that she had a mild stroke in 2013 seen on one MRI but not on another.   Her right side was weak and clumsy and she did PT.    Her stroke was preceded by a headache.  She has noted some visual changes, especially the  left.  She has cataracts.   No retinal tears were seen on recent exam  She also had a vertical sleeve weight loss operation but did not lose much weight.  BMI is 52.    She has OSA and is on CPAP.    I personally reviewed the MRI of the cervical spine performed 05/04/2019.  It shows multilevel degenerative changes most progressed at C5-C6 and C6-C7.  At C5-C6, there is moderately severe foraminal narrowing to the right that could affect the right C6 nerve root.  At C6-C7 she has mild spinal stenosis and moderately severe bilateral foraminal narrowing that could affect the C7 nerve roots.  At C3-C4 she has moderate foraminal narrowing to the left.  Additionally, there appears to be a partially empty sella turcica.  IMPRESSION cervical spine 05/04/2019:: 1. Multilevel cervical spondylosis as described above. Severe bilateral neuroforaminal stenosis at C6-C7. 2. Moderate to severe right neuroforaminal stenosis at C5-C6. Moderate left neuroforaminal stenosis at C3-C4 and C4-C5.  REVIEW OF SYSTEMS: Constitutional: No fevers, chills, sweats, or change in appetite Eyes: Visual changes on the left Ear, nose and throat: No hearing loss, ear pain, nasal congestion, sore throat Cardiovascular: No chest pain, palpitations Respiratory: No shortness of breath at rest or with exertion.   No wheezes GastrointestinaI: No nausea, vomiting, diarrhea, abdominal pain, fecal incontinence Genitourinary: No dysuria, urinary retention or frequency.  No nocturia. Musculoskeletal:She has a history of fibromyalgia.  She also has degenerative changes in her neck.  Integumentary: No rash, pruritus, skin lesions Neurological: as above Psychiatric: No depression at this time.  No anxiety Endocrine: No palpitations, diaphoresis, change in appetite, change in weigh or increased thirst Hematologic/Lymphatic: No anemia, purpura, petechiae. Allergic/Immunologic: No itchy/runny eyes, nasal congestion, recent allergic reactions,  rashes  ALLERGIES: No Known Allergies  HOME MEDICATIONS:  Current Outpatient Medications:  .  BuPROPion HCl (WELLBUTRIN PO), Take 150 mg by mouth daily. , Disp: , Rfl:  .  DULOXETINE HCL PO, Take 90 mg by mouth daily., Disp: , Rfl:  .  Montelukast Sodium (SINGULAIR PO), Take 10 mg by mouth daily. , Disp: , Rfl:  .  rOPINIRole (REQUIP) 0.5 MG tablet, Take 4 mg by mouth at bedtime. , Disp: , Rfl:  .  tiZANidine (ZANAFLEX) 4 MG capsule, Take 4 mg by mouth every 8 (eight) hours as needed for muscle spasms., Disp: , Rfl:  .  traMADol (ULTRAM) 50 MG tablet, Take 50 mg by mouth as needed., Disp: , Rfl:   PAST MEDICAL HISTORY: Past Medical History:  Diagnosis Date  . Anxiety   . Arthritis    lower back, hips  . Asthma   . Bulging of cervical intervertebral disc   . CVA (cerebral vascular accident) ([facility]) 2013  . Depression   . Fibromyalgia   . Hypertension   . Kidney stones   . Migraines     PAST SURGICAL HISTORY: Past Surgical History:  Procedure Laterality Date  . ABDOMINAL HYSTERECTOMY    . CHOLECYSTECTOMY    . KNEE SURGERY    . LAPAROSCOPIC GASTRIC BANDING    . NASAL SINUS SURGERY    . TOTAL KNEE ARTHROPLASTY      FAMILY HISTORY: Family History  Problem Relation Age of Onset  . Seizures Mother   . Thyroid disease Sister   . Cancer Maternal Grandmother   . Cancer Maternal Grandfather   . Epilepsy Paternal Grandfather     SOCIAL HISTORY:  Social History   Socioeconomic History  . Marital status: Married    Spouse name: Not on file  . Number of children: 2  . Years of education: ADN  . Highest education level: Not on file  Occupational History  . Occupation: [employer]  Tobacco Use  . Smoking status: Never Smoker  . Smokeless tobacco: Never Used  Substance and Sexual Activity  . Alcohol use: Yes    Comment: socially   . Drug use: No  . Sexual activity: Not on file  Other Topics Concern  . Not on file  Social History Narrative   3 cups coffee per  day   Tea sometimes    Right handed    Lives with husband, daughter and son-in-law   Social Determinants of Health   Financial Resource Strain:   . Difficulty of Paying Living Expenses:   Food Insecurity:   . Worried About Programme researcher, broadcasting/film/video in the Last Year:   . Barista in the Last Year:   Transportation Needs:   . Freight forwarder (Medical):   Marland Kitchen Lack of Transportation (Non-Medical):   Physical Activity:   . Days of Exercise per Week:   . Minutes of Exercise per Session:   Stress:   . Feeling of Stress :   Social Connections:   . Frequency of Communication with Friends and Family:   . Frequency of Social Gatherings with Friends and Family:   . Attends Religious Services:   .  Active Member of Clubs or Organizations:   . Attends Banker Meetings:   Marland Kitchen Marital Status:   Intimate Partner Violence:   . Fear of Current or Ex-Partner:   . Emotionally Abused:   Marland Kitchen Physically Abused:   . Sexually Abused:      PHYSICAL EXAM  Vitals:   05/19/20 1035  BP: (!) 151/84  Pulse: 74  Weight: 288 lb (130.6 kg)  Height: 5\' 2"  (1.575 m)    Body mass index is 52.68 kg/m.   General: The patient is well-developed and well-nourished and in no acute distress  HEENT:  Head is [place]/AT.  Sclera are anicteric.  Funduscopic exam shows normal optic discs and retinal vessels.  Neck: No carotid bruits are noted.  The neck is mildly tender with a fairly normal range of motion..  Cardiovascular: The heart has a regular rate and rhythm with a normal S1 and S2. There were no murmurs, gallops or rubs.    Skin: Extremities are without rash or  edema.  Musculoskeletal:  Back is mildly tender.  She has tenderness over many of the classic fibromyalgia tender points of the spine and upper chest  Neurologic Exam  Mental status: The patient is alert and oriented x 3 at the time of the examination. The patient has apparent normal recent and remote memory, with an apparently  normal attention span and concentration ability.   Speech is normal.  Cranial nerves: Extraocular movements are full. Pupils are equal, round, and reactive to light and accomodation.  Visual fields are full.  Facial symmetry is present. There is good facial sensation to soft touch bilaterally.Facial strength is normal.  Trapezius and sternocleidomastoid strength is normal. No dysarthria is noted.  The tongue is midline, and the patient has symmetric elevation of the soft palate. No obvious hearing deficits are noted.  Motor:  Muscle bulk is normal.   Tone is normal. Strength is  5 / 5 in all 4 extremities.   Sensory: Sensory testing is intact to pinprick, soft touch and vibration sensation in all 4 extremities.  Coordination: Cerebellar testing reveals good finger-nose-finger and heel-to-shin bilaterally.  Gait and station: Station is normal.   The gait was fairly normal but tandem gait was wide.. Romberg is negative.   Reflexes: Deep tendon reflexes are symmetric and normal bilaterally.   Plantar responses are flexor.    DIAGNOSTIC DATA (LABS, IMAGING, TESTING) - I reviewed patient records, labs, notes, testing and imaging myself where available.  Lab Results  Component Value Date   WBC 11.2 (H) 10/13/2019   HGB 13.9 10/13/2019   HCT 44.7 10/13/2019   MCV 88.0 10/13/2019   PLT 254 10/13/2019      Component Value Date/Time   NA 138 10/13/2019 1957   K 3.6 10/13/2019 1957   CL 99 10/13/2019 1957   CO2 26 10/13/2019 1957   GLUCOSE 96 10/13/2019 1957   BUN 14 10/13/2019 1957   CREATININE 0.70 10/13/2019 1957   CALCIUM 9.5 10/13/2019 1957   PROT 7.7 10/13/2019 1957   ALBUMIN 4.4 10/13/2019 1957   AST 22 10/13/2019 1957   ALT 26 10/13/2019 1957   ALKPHOS 89 10/13/2019 1957   BILITOT 0.6 10/13/2019 1957   GFRNONAA >60 10/13/2019 1957   GFRAA >60 10/13/2019 1957       ASSESSMENT AND PLAN  Numbness - Plan: MR BRAIN WO CONTRAST, Vitamin B12, Copper, serum  Vision  disturbance - Plan: MR BRAIN WO CONTRAST  Fibromyalgia  History of bariatric  surgery - Plan: Vitamin B12, Copper, serum  Verbal fluency disorder   In summary, Ms. Welge is a 55 year old woman with myalgias, numbness, visual changes.  Some of her symptoms are likely due to to the fibromyalgia and her known cervical spine degenerative changes.  We do need to check an MRI of the brain to rule out demyelination or ischemic changes.  Additionally, the MRI of the cervical spine showed an enlarged sella turcica and this can be further evaluated.  She did not have papilledema on examination, so this is likely incidental.  Additionally, due to her history of bariatric surgery we will check vitamin B12 and copper as deficiencies can affect numbness and balance.  I will have her start nabumetone for her pain.  If fibromyalgia symptoms worsen gabapentin could also be started.  She will return to see me as needed based on the results of her studies.  She should call if she has new or worsening neurologic symptoms.  Thank you for asking me to see Ms. Shaker.  Please let me know if I can be of further assistance with her or other patients in the future.  Haizlee Henton A. Felecia Shelling, MD, [facility] 4/56/2563, 89:37 AM Certified in Neurology, Clinical Neurophysiology, Sleep Medicine and Neuroimaging  [facility] [address] [place], [place] [address] [phone]

## 2020-05-24 LAB — COPPER, SERUM: Copper: 122 ug/dL (ref 80–158)

## 2020-05-24 LAB — VITAMIN B12: Vitamin B-12: 444 pg/mL (ref 232–1245)

## 2020-05-31 ENCOUNTER — Telehealth: Payer: Self-pay | Admitting: Neurology

## 2020-05-31 NOTE — Telephone Encounter (Signed)
cigna order sent to GI. They will obtain the auth and reach out to the patient to schedule.  °

## 2020-06-01 ENCOUNTER — Other Ambulatory Visit: Payer: Self-pay | Admitting: Neurology

## 2020-06-17 DIAGNOSIS — E559 Vitamin D deficiency, unspecified: Secondary | ICD-10-CM | POA: Insufficient documentation

## 2020-07-21 ENCOUNTER — Telehealth: Payer: Self-pay | Admitting: Neurology

## 2020-07-21 NOTE — Telephone Encounter (Signed)
Pt scheduled for MRI on 8/8. Pt would like to go ahead and schedule her appt for her result for after her MRI. I scheduled her an appt with Amy.

## 2020-07-21 NOTE — Telephone Encounter (Signed)
Noted  

## 2020-08-07 ENCOUNTER — Ambulatory Visit
Admission: RE | Admit: 2020-08-07 | Discharge: 2020-08-07 | Disposition: A | Discharge status: Home / Self Care | Payer: 59 | Source: Ambulatory Visit | Attending: Neurology | Admitting: Neurology

## 2020-08-07 ENCOUNTER — Other Ambulatory Visit: Payer: Self-pay

## 2020-08-07 DIAGNOSIS — R200 Anesthesia of skin: Secondary | ICD-10-CM

## 2020-08-07 DIAGNOSIS — H539 Unspecified visual disturbance: Secondary | ICD-10-CM

## 2020-08-11 ENCOUNTER — Telehealth: Payer: Self-pay | Admitting: Neurology

## 2020-08-11 NOTE — Telephone Encounter (Signed)
I called and left a message.  MRI of the brain showed some changes in the brain that might occur with aging.  Nothing look brand-new.  If she smokes she should stop as smoking can make things worse.   The pituitary gland was mildly reduced in height.  However, she follows ophthalmology and had an eye exam a couple months ago that showed no papilledema.  Therefore, this is just an incidental finding and does not need to be worked up further

## 2020-08-17 NOTE — Telephone Encounter (Signed)
I spoke with her about below.   She has seen ophtho regularly so flat pituitary unlikely to be due to elevated ICP.  She does not smoke.    Has OSA which could also be a risk for white matter changes.     She uses CPAP

## 2020-10-06 ENCOUNTER — Ambulatory Visit: Payer: POS | Admitting: Family Medicine

## 2024-11-10 ENCOUNTER — Ambulatory Visit: Payer: Self-pay

## 2024-11-10 NOTE — Telephone Encounter (Signed)
  Reason for Disposition  Health information question, no triage required and triager able to answer question  Answer Assessment - Initial Assessment Questions 1. REASON FOR CALL: What is the main reason for your call? or How can I best help you?      Patient reports sinus headache when she uses her CPAP. Also reports that she will need med refills prior to her new pt appt.  This RN recommended she contact her previous PCP (has been since within a year) to request a bridge. Also provided with next [facility] information.  Protocols used: Information Only Call - No Triage-A-AH  Message from Lonell PEDLAR sent at 11/10/2024  3:40 PM EST  Reason for Triage: Patient with worsening sinus issues. Patient has Fibromyalgia and issues with CPAP

## 2025-01-05 ENCOUNTER — Ambulatory Visit: Payer: Self-pay | Admitting: Family Medicine

## 2025-01-05 ENCOUNTER — Encounter: Payer: Self-pay | Admitting: Family Medicine

## 2025-01-05 VITALS — BP 136/84 | HR 85 | Ht 62.0 in | Wt 259.0 lb

## 2025-01-05 DIAGNOSIS — G4733 Obstructive sleep apnea (adult) (pediatric): Secondary | ICD-10-CM | POA: Diagnosis not present

## 2025-01-05 DIAGNOSIS — J189 Pneumonia, unspecified organism: Secondary | ICD-10-CM

## 2025-01-05 DIAGNOSIS — J4541 Moderate persistent asthma with (acute) exacerbation: Secondary | ICD-10-CM

## 2025-01-05 DIAGNOSIS — F4323 Adjustment disorder with mixed anxiety and depressed mood: Secondary | ICD-10-CM | POA: Diagnosis not present

## 2025-01-05 DIAGNOSIS — M797 Fibromyalgia: Secondary | ICD-10-CM | POA: Diagnosis not present

## 2025-01-05 DIAGNOSIS — Z6841 Body Mass Index (BMI) 40.0 and over, adult: Secondary | ICD-10-CM | POA: Diagnosis not present

## 2025-01-05 DIAGNOSIS — E66813 Obesity, class 3: Secondary | ICD-10-CM | POA: Diagnosis not present

## 2025-01-05 DIAGNOSIS — I10 Essential (primary) hypertension: Secondary | ICD-10-CM

## 2025-01-05 DIAGNOSIS — G43009 Migraine without aura, not intractable, without status migrainosus: Secondary | ICD-10-CM

## 2025-01-05 MED ORDER — BUDESONIDE-FORMOTEROL FUMARATE 160-4.5 MCG/ACT IN AERO: 2.0000 | INHALATION_SPRAY | Freq: Two times a day (BID) | RESPIRATORY_TRACT | 3 refills | Status: DC

## 2025-01-05 MED ORDER — METHYLPREDNISOLONE ACETATE 80 MG/ML IJ SUSP
80.0000 mg | Freq: Once | INTRAMUSCULAR | Status: AC
  Administered 2025-01-05: 80 mg via INTRAMUSCULAR

## 2025-01-05 NOTE — Progress Notes (Unsigned)
 "    Diagnoses and Orders:   1. Moderate persistent reactive airway disease with acute exacerbation   2. Adjustment disorder with mixed anxiety and depressed mood   3. Pneumonia due to infectious organism, unspecified laterality, unspecified part of lung   4. Fibromyalgia   5. OSA (obstructive sleep apnea)   6. Benign essential HTN   7. Migraine without aura and without status migrainosus, not intractable   8. Class 3 severe obesity with serious comorbidity and body mass index (BMI) of 45.0 to 49.9 in adult, unspecified obesity type (HCC)    Meds ordered this encounter  Medications   budesonide -formoterol  (SYMBICORT ) 160-4.5 MCG/ACT inhaler    Sig: Inhale 2 puffs into the lungs 2 (two) times daily.    Dispense:  1 each    Refill:  3   omeprazole  (PRILOSEC) 20 MG capsule    Sig: Take 1 capsule (20 mg total) by mouth daily.    Dispense:  90 capsule    Refill:  3   diclofenac  (VOLTAREN ) 75 MG EC tablet    Sig: Take 1 tablet (75 mg total) by mouth 2 (two) times daily.    Dispense:  30 tablet    Refill:  0   methylPREDNISolone  acetate (DEPO-MEDROL ) injection 80 mg   fluconazole  (DIFLUCAN ) 150 MG tablet    Sig: Take one tab on day 1 and another on day 3.    Dispense:  2 tablet    Refill:  0   Orders Placed This Encounter  Procedures   Ambulatory referral to Psychology   Assessment & Plan:   Assessment and Plan Assessment & Plan Moderate persistent asthma with acute exacerbation Acute exacerbation likely due to recent respiratory infections. Current treatment includes Levaquin for pneumonia. Inhaled corticosteroids recommended to reduce systemic side effects. - Prescribed Symbicort  inhaler. - Continue albuterol  inhaler four times a day. - Administered steroid injection for acute exacerbation.  Pneumonia Diagnosed following RSV infection, affecting the right lower lobe. Levaquin effective but causing gastrointestinal upset. Inhaled corticosteroids recommended to aid  recovery. - Continue Levaquin as prescribed. - Prescribed Symbicort  inhaler to aid recovery.  Major depressive disorder Exacerbated by recent life stressors. She has decided to quit her job to focus on self-care. Family support is available, and she is open to therapy. - Referred to therapist for counseling. - Encouraged rest and self-care.  Fibromyalgia Chronic condition with recent increase in symptoms, possibly due to stress. Improvement reported with diclofenac  in the past. - Prescribed diclofenac  for fibromyalgia management. - Continue tizanidine  as needed for muscle fatigue.  Obstructive sleep apnea Difficulty using CPAP due to discomfort and congestion. CPAP use is important for managing sleep apnea. Alternative treatments such as GLP-1 agonists may aid in management. - Referred to sleep specialist for CPAP management. - Discussed potential use of GLP-1 agonists for sleep apnea management.  Hypertensive emergency in November Current blood pressure is 136/84. She has not been taking hydrochlorothiazide  consistently due to recent illness. - Continue hydrochlorothiazide  as prescribed.  Migraine Headaches previously managed without medication. No current acute migraine symptoms reported.  Geni Shutter, DO, MS, FAAFP, Dipl. [facility] [address] [place], [address] Dept: [phone] Dept Fax: [phone]  Subjective:   History of Present Illness Nicole Russell is a 60 year old female with fibromyalgia and a history of stroke who presents with pneumonia and persistent respiratory symptoms.  Respiratory symptoms - Persistent cough and congestion following RSV infection that progressed to pneumonia, diagnosed  in the emergency department - Currently on oral antibiotics, experiencing stomach upset as a side effect - Continued cough and congestion despite antibiotic therapy - Uses albuterol  inhaler four times daily -  Completed a five day course of prednisone from the emergency department - Received Tessalon Perles and other cough medicines without relief - Intermittent sweats and chills since onset of illness and treatment  Sleep disturbance - Difficulty sleeping, primarily due to racing thoughts at night  Pain syndromes - Severe fibromyalgia and migraines - No longer uses tramadol for pain management - Previously benefited from Voltaren  gel and diclofenac  for pain control  Cerebrovascular disease - Mild stroke in 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  History:   Reviewed by clinician on day of visit: allergies, medications, problem list, medical history, surgical history, family history, social history, and previous encounter notes.  Medications:   Show/hide medication list[1] Allergies[2]  Objective:   BP 136/84 (BP Location: Right Arm, Cuff Size: Large)   Pulse 85   Ht 5' 2 (1.575 m)   Wt 259 lb (117.5 kg)   SpO2 97%   BMI 47.37 kg/m   Physical Exam Constitutional:      General: She is not in acute distress.    Appearance: She is well-developed.  HENT:     Head: Normocephalic and atraumatic.  Eyes:     Conjunctiva/sclera: Conjunctivae normal.  Cardiovascular:     Rate and Rhythm: Normal rate and regular rhythm.     Heart sounds: Normal heart sounds.  Pulmonary:     Effort:  Pulmonary effort is normal.  Neurological:     General: No focal deficit present.     Mental Status: She is alert.  Psychiatric:        Behavior: Behavior normal.     Attestations:   Patient is establishing care in this system with me as PCP. Available records reviewed. Chart updated today with reconciliation of problem list, medications, allergies, and relevant history. Preventive care and chronic disease status reviewed.  Outside labs reviewed and will be abstracted. Portions of historical chart may remain incomplete; will update on an ongoing basis as clinically indicated.  Reviewed by clinician on day of visit: allergies, medications, problem list, medical history, surgical history, family history, social history, and previous encounter notes. Discussed the use of AI scribe software for clinical note transcription with the patient, who gave verbal consent to proceed.     [1]  Outpatient Medications Prior to Visit  Medication Sig   albuterol  (VENTOLIN  HFA) 108 (90 Base) MCG/ACT inhaler Inhale 2 puffs into the lungs.   benzonatate (TESSALON) 100 MG capsule Take 100 mg by mouth every 8 (eight) hours.   BuPROPion  HCl (WELLBUTRIN  PO) Take 150 mg by mouth daily.    Colchicine  (MITIGARE ) 0.6 MG CAPS Take 0.6 mg by mouth.   DULOXETINE  HCL PO Take 60 mg by mouth daily.   hydrochlorothiazide  (HYDRODIURIL ) 25 MG tablet Take 25 mg by mouth  daily.   levofloxacin (LEVAQUIN) 750 MG tablet Take 750 mg by mouth daily.   Montelukast  Sodium (SINGULAIR  PO) Take 10 mg by mouth daily.    pregabalin  (LYRICA ) 75 MG capsule Take 75 mg by mouth 2 (two) times daily.   rOPINIRole  (REQUIP ) 0.5 MG tablet Take 4 mg by mouth at bedtime.    tiZANidine  (ZANAFLEX ) 4 MG capsule Take 4 mg by mouth every 8 (eight) hours as needed for muscle spasms.   traMADol (ULTRAM) 50 MG tablet Take 50 mg by mouth as needed.   [DISCONTINUED] ALPRAZolam  (XANAX ) 0.5 MG tablet Take 2 or 3 before MRI   [DISCONTINUED] nabumetone  (RELAFEN )  500 MG tablet Take 1 tablet (500 mg total) by mouth 2 (two) times daily.   [DISCONTINUED] omeprazole  (PRILOSEC) 20 MG capsule Take 20 mg by mouth daily.   No facility-administered medications prior to visit.  [2] No Known Allergies  "

## 2025-01-07 DIAGNOSIS — M797 Fibromyalgia: Secondary | ICD-10-CM

## 2025-01-07 DIAGNOSIS — J4541 Moderate persistent asthma with (acute) exacerbation: Secondary | ICD-10-CM

## 2025-01-07 DIAGNOSIS — I10 Essential (primary) hypertension: Secondary | ICD-10-CM

## 2025-01-07 NOTE — Telephone Encounter (Signed)
 Patient called in to check on the status of it something will be sent in the yeast and vaginal infection that she has? Patient also would like an update on the psychiatry referral.

## 2025-01-10 MED ORDER — OMEPRAZOLE 20 MG PO CPDR: 20.0000 mg | DELAYED_RELEASE_CAPSULE | Freq: Every day | ORAL | 3 refills | Status: AC

## 2025-01-10 MED ORDER — DICLOFENAC SODIUM 75 MG PO TBEC: 75.0000 mg | DELAYED_RELEASE_TABLET | Freq: Two times a day (BID) | ORAL | 0 refills | Status: DC

## 2025-01-11 ENCOUNTER — Encounter: Payer: Self-pay | Admitting: Family Medicine

## 2025-01-11 MED ORDER — FLUCONAZOLE 150 MG PO TABS: ORAL_TABLET | ORAL | 0 refills | Status: DC

## 2025-01-12 ENCOUNTER — Telehealth: Payer: Self-pay

## 2025-01-12 NOTE — Telephone Encounter (Signed)
 Pt sent a MyChart message. I will document in that encounter.

## 2025-01-12 NOTE — Telephone Encounter (Signed)
 Copied from CRM [id number]. Topic: Clinical - Medication Question >> Jan 11, 2025 11:35 AM Burnard DEL wrote: Reason for CRM: Patient came in for her NP appointment on 01/06/2024,provider was supposed to be sending prescriptions in  for all of her medications that she was currently taking while living in [place] . Patient called to check on status of those prescriptions being sent in to   [facility], [place] - [address]  Phone: [phone] Fax: [phone]

## 2025-01-12 NOTE — Telephone Encounter (Signed)
 Copied from CRM [id number]. Topic: General - Call Back - No Documentation >> Jan 12, 2025 12:09 PM Viola F wrote: Reason for CRM: Patient returned Dakota's call - says she's running out of medication and would like a call back today at [phone] [facility])

## 2025-01-13 MED ORDER — PREGABALIN 75 MG PO CAPS: 75.0000 mg | ORAL_CAPSULE | Freq: Two times a day (BID) | ORAL | 0 refills | Status: AC

## 2025-01-13 MED ORDER — BUPROPION HCL ER (XL) 150 MG PO TB24: 150.0000 mg | ORAL_TABLET | Freq: Every day | ORAL | 1 refills | Status: AC

## 2025-01-13 MED ORDER — DULOXETINE HCL 60 MG PO CPEP: 60.0000 mg | ORAL_CAPSULE | Freq: Every day | ORAL | 1 refills | Status: AC

## 2025-01-13 MED ORDER — COLCHICINE 0.6 MG PO CAPS: 0.6000 mg | ORAL_CAPSULE | Freq: Two times a day (BID) | ORAL | 1 refills | Status: DC | PRN

## 2025-01-13 MED ORDER — HYDROCHLOROTHIAZIDE 25 MG PO TABS: 25.0000 mg | ORAL_TABLET | Freq: Every day | ORAL | 1 refills | Status: DC

## 2025-01-13 MED ORDER — ROPINIROLE HCL 2 MG PO TABS: 4.0000 mg | ORAL_TABLET | Freq: Every day | ORAL | 1 refills | Status: AC

## 2025-01-13 MED ORDER — TIZANIDINE HCL 4 MG PO CAPS: 4.0000 mg | ORAL_CAPSULE | Freq: Three times a day (TID) | ORAL | 0 refills | Status: AC | PRN

## 2025-01-13 MED ORDER — ALBUTEROL SULFATE HFA 108 (90 BASE) MCG/ACT IN AERS: 2.0000 | INHALATION_SPRAY | Freq: Four times a day (QID) | RESPIRATORY_TRACT | 1 refills | Status: AC | PRN

## 2025-01-13 MED ORDER — MONTELUKAST SODIUM 10 MG PO TABS: 10.0000 mg | ORAL_TABLET | Freq: Every day | ORAL | 1 refills | Status: AC

## 2025-01-13 NOTE — Telephone Encounter (Signed)
 I have entered the medications that pt is requesting for refill.

## 2025-01-21 ENCOUNTER — Ambulatory Visit: Payer: Self-pay | Admitting: Internal Medicine

## 2025-02-02 ENCOUNTER — Ambulatory Visit: Admitting: Family Medicine

## 2025-02-04 ENCOUNTER — Ambulatory Visit: Admitting: Family Medicine

## 2025-02-04 ENCOUNTER — Encounter: Payer: Self-pay | Admitting: Family Medicine

## 2025-02-04 VITALS — BP 120/70 | HR 73 | Ht 60.0 in | Wt 262.4 lb

## 2025-02-04 DIAGNOSIS — Z9884 Bariatric surgery status: Secondary | ICD-10-CM

## 2025-02-04 DIAGNOSIS — E1169 Type 2 diabetes mellitus with other specified complication: Secondary | ICD-10-CM

## 2025-02-04 DIAGNOSIS — G4733 Obstructive sleep apnea (adult) (pediatric): Secondary | ICD-10-CM

## 2025-02-04 DIAGNOSIS — Z79899 Other long term (current) drug therapy: Secondary | ICD-10-CM

## 2025-02-04 DIAGNOSIS — R600 Localized edema: Secondary | ICD-10-CM | POA: Insufficient documentation

## 2025-02-04 DIAGNOSIS — F4323 Adjustment disorder with mixed anxiety and depressed mood: Secondary | ICD-10-CM | POA: Insufficient documentation

## 2025-02-04 DIAGNOSIS — M797 Fibromyalgia: Secondary | ICD-10-CM

## 2025-02-04 DIAGNOSIS — Z6841 Body Mass Index (BMI) 40.0 and over, adult: Secondary | ICD-10-CM

## 2025-02-04 MED ORDER — DICLOFENAC SODIUM 75 MG PO TBEC: 75.0000 mg | DELAYED_RELEASE_TABLET | Freq: Two times a day (BID) | ORAL | 0 refills | Status: AC

## 2025-02-04 MED ORDER — HYDROCHLOROTHIAZIDE 25 MG PO TABS: 25.0000 mg | ORAL_TABLET | ORAL | 3 refills | Status: AC | PRN

## 2025-02-04 MED ORDER — TIRZEPATIDE 2.5 MG/0.5ML ~~LOC~~ SOAJ: 2.5000 mg | SUBCUTANEOUS | 1 refills | Status: AC

## 2025-02-04 NOTE — Progress Notes (Signed)
 FIBRO MIGRAINE TAKE OFF TRAMADOL AND SYMBICORT , PROMETHAZINE COLCHICONE - NONE IN ONE YEAR DM - ZEPBOUND  - SEE LAB BEFORE BARIATRIC       Patient Care Team: Prentiss Frieze, DO as PCP - General (Family Medicine)  Weight Management:   Patient is under the care of an Obesity Medicine-certified physician providing longitudinal care using a comprehensive, pillar-based obesity treatment model (nutrition therapy, physical activity counseling, behavioral modification, pharmacotherapy when indicated, and medical monitoring), consistent with standards outlined by the Obesity Medicine Association. Obesity is treated as a medical disease, not a cosmetic condition. Counseling provided regarding risks, benefits, expected outcomes, potential adverse effects, and need for long-term therapy. No contraindications identified (no personal or family history of medullary thyroid carcinoma or MEN2; no history of pancreatitis). Weight, BMI, waist circumference, blood pressure, relevant metabolic markers (as applicable), medication tolerability, and adherence will be monitored at regular follow-up intervals. Lifestyle intervention alone has been insufficient. Pharmacologic therapy is medically necessary to achieve clinically meaningful and durable weight loss and to reduce obesity-related morbidity. Medication is prescribed to treat chronic obesity, not for cosmetic weight loss. Pharmacotherapy is expected to improve obesity-related comorbid conditions, including obstructive sleep apnea, cardiovascular risk, and/or metabolic liver disease.   Diagnoses and Orders:   1. Medication management   2. Type 2 diabetes mellitus with other specified complication, without long-term current use of insulin (HCC); A1c 6.9 on 10/08/208   3. Fibromyalgia   4. Adjustment disorder with mixed anxiety and depressed mood   5. OSA (obstructive sleep apnea)   6. Lower extremity edema   7. History of bariatric surgery   8. Class 3 severe  obesity with serious comorbidity and body mass index (BMI) of 50.0 to 59.9 in adult, unspecified obesity type (HCC)    Meds ordered this encounter  Medications   diclofenac  (VOLTAREN ) 75 MG EC tablet    Sig: Take 1 tablet (75 mg total) by mouth 2 (two) times daily.    Dispense:  180 tablet    Refill:  0   hydrochlorothiazide  (HYDRODIURIL ) 25 MG tablet    Sig: Take 1 tablet (25 mg total) by mouth as needed (if she has water retention).    Dispense:  90 tablet    Refill:  3   tirzepatide  (MOUNJARO ) 2.5 MG/0.5ML Pen    Sig: Inject 2.5 mg into the skin once a week.    Dispense:  2 mL    Refill:  1   Medications Discontinued During This Encounter  Medication   benzonatate (TESSALON) 100 MG capsule   levofloxacin (LEVAQUIN) 750 MG tablet   fluconazole  (DIFLUCAN ) 150 MG tablet   diclofenac  (VOLTAREN ) 75 MG EC tablet   traMADol (ULTRAM) 50 MG tablet   levofloxacin (LEVAQUIN) 750 MG tablet   budesonide -formoterol  (SYMBICORT ) 160-4.5 MCG/ACT inhaler   fluconazole  (DIFLUCAN ) 150 MG tablet   Colchicine  (MITIGARE ) 0.6 MG CAPS   promethazine-dextromethorphan (PROMETHAZINE-DM) 6.25-15 MG/5ML syrup   hydrochlorothiazide  (HYDRODIURIL ) 25 MG tablet - changed to PRN   Assessment & Plan:   Assessment & Plan Diabetes A1c 6.9 on 10/07/2017 Medication: None. Issues reviewed: blood sugar goals, complications of diabetes mellitus, exercise, and nutrition. Plan: Will start Mounjaro . Risk versus benefits of medication reviewed. The patient understands monitoring parameters and red flags.   Adjustment disorder with mixed anxiety and depressed mood Significant improvement in symptoms with a decrease in PHQ-9 score from 22 to 9. Weekly therapy sessions have been beneficial. She is motivated to continue therapy and is exploring new coping strategies. - Continue  weekly therapy sessions. - Continue current medication regimen including Wellbutrin  and Cymbalta . - Will consider reducing Cymbalta  to 60 mg if  symptoms remain stable.  Fibromyalgia Recent lifestyle changes, including improved diet and reduced stress, have led to a decrease in flare-ups. Tramadol was used for flare-ups and migraines but has not been needed recently. Concerns about serotonin syndrome with tramadol and Cymbalta  were discussed. - Continue current management with lifestyle modifications. - Discontinued tramadol due to lack of recent need and potential serotonin syndrome risk. - Continue Voltaren  as needed for pain management.  Obstructive sleep apnea Requip  is essential for sleep, and she reports improved sleep quality with its use. - Continue Requip  for sleep management.  Benign essential hypertension Hydrochlorothiazide  is used as needed for fluid retention. - Continue hydrochlorothiazide  as needed for fluid retention.  Obesity Weight management is a priority. Previous use of semaglutide resulted in significant weight loss. She is interested in continuing weight management efforts to improve overall health and energy levels. Discussed the potential benefits of GLP-1 agonists for weight loss and metabolic health. - Prescribed tirzepatide  (Zepbound ) for weight management. - Encouraged continued lifestyle modifications including diet and exercise.  Subjective:   History of Present Illness Nicole Russell is a 60 year old female who presents for medication review and follow-up on mental health progress.  Depressive symptoms - Significant improvement in depressive symptoms with PHQ-9 score decreased from 22 to 9 - Attending weekly therapy sessions at [facility] in [place] (4 sessions completed) - Currently taking duloxetine , increased to 90 mg for depressive symptoms, previously reduced to 60 mg when symptoms improved - Expresses desire to minimize medication use, feeling like a 'junkie' on current regimen - Also taking bupropion  (Wellbutrin ) in combination with duloxetine  for depression  Fibromyalgia and  musculoskeletal pain - History of fibromyalgia managed with lifestyle modifications including improved diet, better sleep, and stress reduction - Significant reduction in flare-ups, with only one minor episode lasting one day since moving to [place]  - Previously used tramadol for flare-ups and migraines, but has not needed it recently - Uses oral Voltaren  for muscle tension, which has been effective; no need for topical cream  Gout - History of gout with last episode over a year ago - Uses colchicine  as needed, but has not required it in a long time  Respiratory symptoms - Uses albuterol  for respiratory issues - Symbicort  used only during a recent illness  Gastrointestinal symptoms - Takes omeprazole  regularly for gastrointestinal issues  Weight management - Previously lost 40 pounds on semaglutide but experienced a plateau - Interested in continuing weight management efforts to improve overall health and energy levels  Review of Systems: Negative, with the exception of above mentioned in HPI.  History:   Reviewed by clinician on day of visit: allergies, medications, problem list, medical history, surgical history, family history, social history, and previous encounter notes.  Medications:   Show/hide medication list[1] Allergies[2]  Physical Exam:   BP 120/70 Comment: forearm  Pulse 73   Ht 5' (1.524 m)   Wt 262 lb 6.4 oz (119 kg)   SpO2 94%   BMI 51.25 kg/m   Physical Exam Constitutional:      General: She is not in acute distress.    Appearance: She is well-developed.  HENT:     Head: Normocephalic and atraumatic.  Eyes:     Conjunctiva/sclera: Conjunctivae normal.  Cardiovascular:     Rate and Rhythm: Normal rate and regular rhythm.     Heart  sounds: Normal heart sounds.  Pulmonary:     Effort: Pulmonary effort is normal.     Breath sounds: Normal breath sounds.  Neurological:     General: No focal deficit present.     Mental Status: She is alert.   Psychiatric:        Behavior: Behavior normal.        [1]  Outpatient Medications Prior to Visit  Medication Sig   albuterol  (VENTOLIN  HFA) 108 (90 Base) MCG/ACT inhaler Inhale 2 puffs into the lungs every 6 (six) hours as needed for wheezing or shortness of breath.   buPROPion  (WELLBUTRIN  XL) 150 MG 24 hr tablet Take 1 tablet (150 mg total) by mouth daily.   DULoxetine  (CYMBALTA ) 60 MG capsule Take 1 capsule (60 mg total) by mouth daily. (Patient taking differently: Take 90 mg by mouth daily.)   montelukast  (SINGULAIR ) 10 MG tablet Take 1 tablet (10 mg total) by mouth daily.   omeprazole  (PRILOSEC) 20 MG capsule Take 1 capsule (20 mg total) by mouth daily.   pregabalin  (LYRICA ) 75 MG capsule Take 1 capsule (75 mg total) by mouth 2 (two) times daily.   rOPINIRole  (REQUIP ) 2 MG tablet Take 2 tablets (4 mg total) by mouth at bedtime.   tiZANidine  (ZANAFLEX ) 4 MG capsule Take 1 capsule (4 mg total) by mouth every 8 (eight) hours as needed for muscle spasms.   [DISCONTINUED] Colchicine  (MITIGARE ) 0.6 MG CAPS Take 1 capsule (0.6 mg total) by mouth 2 (two) times daily as needed.   [DISCONTINUED] diclofenac  (VOLTAREN ) 75 MG EC tablet Take 1 tablet (75 mg total) by mouth 2 (two) times daily.   [DISCONTINUED] hydrochlorothiazide  (HYDRODIURIL ) 25 MG tablet Take 1 tablet (25 mg total) by mouth daily. (Patient taking differently: Take 25 mg by mouth as needed (if she has water retention).)   [DISCONTINUED] promethazine-dextromethorphan (PROMETHAZINE-DM) 6.25-15 MG/5ML syrup Take 5 mLs by mouth every 4 (four) hours as needed.   [DISCONTINUED] benzonatate (TESSALON) 100 MG capsule Take 100 mg by mouth every 8 (eight) hours. (Patient not taking: Reported on 02/04/2025)   [DISCONTINUED] budesonide -formoterol  (SYMBICORT ) 160-4.5 MCG/ACT inhaler Inhale 2 puffs into the lungs 2 (two) times daily. (Patient not taking: Reported on 02/04/2025)   [DISCONTINUED] fluconazole  (DIFLUCAN ) 150 MG tablet Take one tab on day  1 and another on day 3. (Patient not taking: Reported on 02/04/2025)   [DISCONTINUED] levofloxacin (LEVAQUIN) 750 MG tablet Take 750 mg by mouth daily. (Patient not taking: Reported on 02/04/2025)   [DISCONTINUED] traMADol (ULTRAM) 50 MG tablet Take 50 mg by mouth as needed.   No facility-administered medications prior to visit.  [2]  Allergies Allergen Reactions   Doxycycline Diarrhea   Amitriptyline     Other Reaction(s): Mental Status Changes  Other Reaction(s): Mental Status Changes    Mood swings  Mood swings   Propranolol Other (See Comments)    Other Reaction(s): Dizziness  Bradycardia   Topiramate     Other Reaction(s): Mental Status Changes  Other Reaction(s): Mental Status Changes    Mood swings  Mood swings   Ciprofloxacin Other (See Comments)   Erythromycin Hives   Meloxicam Nausea Only   Wound Dressing Adhesive Hives and Itching    PT STATES SHE EXPERIENCED A SEVERE RASH TO THE ADHESIVE USED IN HER 2016 LAP BAND REMOVAL SURGERY.   Sulfa Antibiotics Nausea Only    Other reaction(s): pain  2016-07-21 Conversion,ENT ENT Migrated Allergy   Zonisamide Nausea Only

## 2025-02-09 ENCOUNTER — Ambulatory Visit: Admitting: Family Medicine

## 2025-03-11 ENCOUNTER — Ambulatory Visit: Admitting: Family Medicine
